# Patient Record
Sex: Male | Born: 1970
Health system: Southern US, Community
[De-identification: ages and names within clinical notes are randomized; demographics above are authoritative.]

## PROBLEM LIST (undated history)

## (undated) DIAGNOSIS — I7100 Dissection of unspecified site of aorta: Secondary | ICD-10-CM

## (undated) DIAGNOSIS — J309 Allergic rhinitis, unspecified: Secondary | ICD-10-CM

## (undated) DIAGNOSIS — T7840XA Allergy, unspecified, initial encounter: Secondary | ICD-10-CM

## (undated) DIAGNOSIS — R7303 Prediabetes: Secondary | ICD-10-CM

## (undated) DIAGNOSIS — E349 Endocrine disorder, unspecified: Secondary | ICD-10-CM

## (undated) DIAGNOSIS — E559 Vitamin D deficiency, unspecified: Secondary | ICD-10-CM

## (undated) DIAGNOSIS — E119 Type 2 diabetes mellitus without complications: Secondary | ICD-10-CM

## (undated) DIAGNOSIS — I10 Essential (primary) hypertension: Secondary | ICD-10-CM

## (undated) DIAGNOSIS — I714 Abdominal aortic aneurysm, without rupture: Secondary | ICD-10-CM

## (undated) DIAGNOSIS — G4730 Sleep apnea, unspecified: Secondary | ICD-10-CM

## (undated) HISTORY — DX: Type 2 diabetes mellitus without complications: E11.9

## (undated) HISTORY — DX: Allergic rhinitis, unspecified: J30.9

## (undated) HISTORY — DX: Endocrine disorder, unspecified: E34.9

## (undated) HISTORY — DX: Abdominal aortic aneurysm, without rupture: I71.4

## (undated) HISTORY — DX: Dissection of unspecified site of aorta: I71.00

## (undated) HISTORY — DX: Vitamin D deficiency, unspecified: E55.9

## (undated) HISTORY — DX: Allergy, unspecified, initial encounter: T78.40XA

## (undated) HISTORY — DX: Essential (primary) hypertension: I10

---

## 2009-03-07 HISTORY — PX: AORTA SURGERY: SHX548

## 2011-06-23 DIAGNOSIS — I7103 Dissection of thoracoabdominal aorta: Secondary | ICD-10-CM | POA: Insufficient documentation

## 2011-06-24 DIAGNOSIS — E349 Endocrine disorder, unspecified: Secondary | ICD-10-CM | POA: Insufficient documentation

## 2011-06-24 DIAGNOSIS — E559 Vitamin D deficiency, unspecified: Secondary | ICD-10-CM | POA: Insufficient documentation

## 2011-06-24 HISTORY — DX: Vitamin D deficiency, unspecified: E55.9

## 2011-06-24 HISTORY — DX: Endocrine disorder, unspecified: E34.9

## 2011-08-16 ENCOUNTER — Emergency Department: Payer: Self-pay | Admitting: Emergency Medicine

## 2011-08-16 LAB — CBC
HCT: 41.6 % (ref 40.0–52.0)
HGB: 13.6 g/dL (ref 13.0–18.0)
MCH: 28.4 pg (ref 26.0–34.0)
MCHC: 32.8 g/dL (ref 32.0–36.0)
MCV: 87 fL (ref 80–100)
RBC: 4.81 10*6/uL (ref 4.40–5.90)
RDW: 13.5 % (ref 11.5–14.5)

## 2011-08-16 LAB — COMPREHENSIVE METABOLIC PANEL
Albumin: 4 g/dL (ref 3.4–5.0)
Anion Gap: 8 (ref 7–16)
Calcium, Total: 9.3 mg/dL (ref 8.5–10.1)
Chloride: 104 mmol/L (ref 98–107)
EGFR (Non-African Amer.): >60
Glucose: 133 mg/dL — ABNORMAL HIGH (ref 65–99)
Osmolality: 282 (ref 275–301)
Potassium: 4.5 mmol/L (ref 3.5–5.1)
SGOT(AST): 34 U/L (ref 15–37)
SGPT (ALT): 42 U/L
Sodium: 140 mmol/L (ref 136–145)

## 2012-06-01 DIAGNOSIS — F419 Anxiety disorder, unspecified: Secondary | ICD-10-CM | POA: Insufficient documentation

## 2012-07-09 DIAGNOSIS — G4700 Insomnia, unspecified: Secondary | ICD-10-CM | POA: Insufficient documentation

## 2013-03-12 DIAGNOSIS — IMO0002 Reserved for concepts with insufficient information to code with codable children: Secondary | ICD-10-CM | POA: Insufficient documentation

## 2014-11-06 ENCOUNTER — Ambulatory Visit (INDEPENDENT_AMBULATORY_CARE_PROVIDER_SITE_OTHER): Payer: 59 | Admitting: Family Medicine

## 2014-11-06 ENCOUNTER — Encounter: Payer: Self-pay | Admitting: Family Medicine

## 2014-11-06 VITALS — BP 136/72 | HR 88 | Temp 98.0°F | Resp 18 | Ht 74.0 in | Wt 273.7 lb

## 2014-11-06 DIAGNOSIS — R519 Headache, unspecified: Secondary | ICD-10-CM | POA: Insufficient documentation

## 2014-11-06 DIAGNOSIS — R51 Headache: Secondary | ICD-10-CM

## 2014-11-06 DIAGNOSIS — I7100 Dissection of unspecified site of aorta: Secondary | ICD-10-CM | POA: Insufficient documentation

## 2014-11-06 DIAGNOSIS — I10 Essential (primary) hypertension: Secondary | ICD-10-CM

## 2014-11-06 DIAGNOSIS — R5383 Other fatigue: Secondary | ICD-10-CM | POA: Diagnosis not present

## 2014-11-06 DIAGNOSIS — J309 Allergic rhinitis, unspecified: Secondary | ICD-10-CM

## 2014-11-06 HISTORY — DX: Allergic rhinitis, unspecified: J30.9

## 2014-11-06 MED ORDER — AZITHROMYCIN 250 MG PO TABS: ORAL_TABLET | ORAL | Status: DC

## 2014-11-06 NOTE — Progress Notes (Signed)
Name: Marcus Zavala   MRN: [id number]    DOB: 09/18/1970   Date:11/06/2014       Progress Note  Subjective  Chief Complaint  Chief Complaint  Patient presents with  . Establish Care    NP (Dr. Carman Ching)  . Hypertension    Hypertension This is a chronic problem. Associated symptoms include headaches and malaise/fatigue (frequently feeling tired.). Pertinent negatives include no chest pain, neck pain or shortness of breath. Past treatments include calcium channel blockers, angiotensin blockers and diuretics. There is no history of kidney disease, CAD/MI or CVA.   Fatigue Pt. Is here for evaluation of feeling 'tired' This is described as being drowsy. He denies feeling lack of energy. No hx of depression.   Past Medical History  Diagnosis Date  . Allergy   . Hypertension     Past Surgical History  Procedure Laterality Date  . Aorta surgery  03/2009    Family History  Problem Relation Age of Onset  . Diabetes Mother   . Heart disease Father   . Cancer Father     Lymphatic  . Heart disease Sister   . Diabetes Sister   . Heart disease Brother   . Diabetes Brother     Social History   Social History  . Marital Status: Married    Spouse Name: N/A  . Number of Children: N/A  . Years of Education: N/A   Occupational History  . Not on file.   Social History Main Topics  . Smoking status: Never Smoker   . Smokeless tobacco: Never Used  . Alcohol Use: No  . Drug Use: No  . Sexual Activity:    Partners: Female   Other Topics Concern  . Not on file   Social History Narrative  . No narrative on file     Current outpatient prescriptions:  .  Azelastine HCl (ASTEPRO) 0.15 % SOLN, Place into the nose., Disp: , Rfl:  .  fluticasone (FLONASE) 50 MCG/ACT nasal spray, Place into the nose., Disp: , Rfl:  .  NEEDLE, DISP, 22 G 22G X 1-1/2" MISC, , Disp: , Rfl:  .  amLODipine (NORVASC) 5 MG tablet, Take 1 tablet by mouth at bedtime., Disp: , Rfl: 11 .  Loratadine 10 MG  CAPS, Take by mouth., Disp: , Rfl:  .  losartan-hydrochlorothiazide (HYZAAR) 100-25 MG per tablet, Take 1 tablet by mouth at bedtime., Disp: , Rfl:  .  Multiple Vitamin (MULTIVITAMIN) capsule, Take 1 capsule by mouth daily., Disp: , Rfl:  .  testosterone cypionate (DEPOTESTOSTERONE CYPIONATE) 200 MG/ML injection, INJECT 0.5 ML IM Q 7 DAYS, Disp: , Rfl: 3  Allergies  Allergen Reactions  . Ciprofloxacin   . Lisinopril   . Penicillins     Review of Systems  Constitutional: Positive for malaise/fatigue (frequently feeling tired.). Negative for fever, chills and weight loss.  HENT: Negative for congestion and sore throat.   Respiratory: Negative for shortness of breath.   Cardiovascular: Negative for chest pain.  Gastrointestinal: Negative for abdominal pain and blood in stool.  Musculoskeletal: Negative for myalgias, back pain and neck pain.  Neurological: Positive for headaches.  Endo/Heme/Allergies: Positive for environmental allergies.  Psychiatric/Behavioral: Negative for depression. The patient is not nervous/anxious.     Objective  Filed Vitals:   11/06/14 1512  BP: 144/88  Pulse: 88  Temp: 98 F (36.7 C)  TempSrc: Oral  Resp: 18  Height: 6\' 2"  (1.88 m)  Weight: 273 lb 11.2 oz (124.15 kg)  SpO2: 98%    Physical Exam  Constitutional: He is oriented to person, place, and time and well-developed, well-nourished, and in no distress.  HENT:  Head: Normocephalic and atraumatic.  Mouth/Throat: No posterior oropharyngeal erythema.  Tenderness to palpation over ethmoid sinus  Eyes: Pupils are equal, round, and reactive to light.  Neck: Normal range of motion. Neck supple.  Cardiovascular: Normal rate and regular rhythm.   Pulmonary/Chest: Effort normal and breath sounds normal.  Abdominal: Soft. Bowel sounds are normal.  Neurological: He is alert and oriented to person, place, and time.  Skin: Skin is warm and dry.  Nursing note and vitals reviewed.   Assessment &  Plan  1. Essential hypertension Blood pressure on manual repeat recheck is at goal. Patient reassure  2. Aortic dissection Stable and is being followed by cardiology at [facility].  3. Other fatigue Pain lab work for evaluation of persistent fatigue.  - CBC with Differential - Comprehensive Metabolic Panel (CMET) - TSH - Vitamin D (25 hydroxy) - Testosterone - B12  4. Sinus headache  - azithromycin (ZITHROMAX Z-PAK) 250 MG tablet; 2 tabs po x day 1, then 1 tab po q day x 4 days.  Dispense: 6 each; Refill: 0   [name] Asad [facility] 11/06/2014 3:34 PM

## 2014-11-12 LAB — CBC WITH DIFFERENTIAL/PLATELET
Basophils Absolute: 0 10*3/uL (ref 0.0–0.2)
Basos: 0 %
EOS (ABSOLUTE): 0.3 10*3/uL (ref 0.0–0.4)
EOS: 5 %
HEMATOCRIT: 45.6 % (ref 37.5–51.0)
Hemoglobin: 15.4 g/dL (ref 12.6–17.7)
IMMATURE GRANS (ABS): 0.1 10*3/uL (ref 0.0–0.1)
IMMATURE GRANULOCYTES: 2 %
LYMPHS: 21 %
Lymphocytes Absolute: 1.6 10*3/uL (ref 0.7–3.1)
MCH: 29.6 pg (ref 26.6–33.0)
MCHC: 33.8 g/dL (ref 31.5–35.7)
MCV: 88 fL (ref 79–97)
MONOCYTES: 8 %
Monocytes Absolute: 0.6 10*3/uL (ref 0.1–0.9)
NEUTROS PCT: 64 %
Neutrophils Absolute: 4.9 10*3/uL (ref 1.4–7.0)
Platelets: 276 10*3/uL (ref 150–379)
RBC: 5.21 x10E6/uL (ref 4.14–5.80)
RDW: 13.7 % (ref 12.3–15.4)
WBC: 7.6 10*3/uL (ref 3.4–10.8)

## 2014-11-12 LAB — COMPREHENSIVE METABOLIC PANEL
A/G RATIO: 1.7 (ref 1.1–2.5)
ALT: 22 IU/L (ref 0–44)
AST: 21 IU/L (ref 0–40)
Albumin: 4.5 g/dL (ref 3.5–5.5)
Alkaline Phosphatase: 37 IU/L — ABNORMAL LOW (ref 39–117)
BUN/Creatinine Ratio: 12 (ref 9–20)
BUN: 13 mg/dL (ref 6–24)
Bilirubin Total: 0.7 mg/dL (ref 0.0–1.2)
CALCIUM: 9.8 mg/dL (ref 8.7–10.2)
CO2: 27 mmol/L (ref 18–29)
CREATININE: 1.13 mg/dL (ref 0.76–1.27)
Chloride: 98 mmol/L (ref 97–108)
GFR, EST AFRICAN AMERICAN: 90 mL/min/{1.73_m2} (ref >59)
GFR, EST NON AFRICAN AMERICAN: 78 mL/min/{1.73_m2} (ref >59)
GLUCOSE: 114 mg/dL — AB (ref 65–99)
Globulin, Total: 2.7 g/dL (ref 1.5–4.5)
POTASSIUM: 4.1 mmol/L (ref 3.5–5.2)
Sodium: 142 mmol/L (ref 134–144)
TOTAL PROTEIN: 7.2 g/dL (ref 6.0–8.5)

## 2014-11-12 LAB — VITAMIN D 25 HYDROXY (VIT D DEFICIENCY, FRACTURES): VIT D 25 HYDROXY: 32.6 ng/mL (ref 30.0–100.0)

## 2014-11-12 LAB — TESTOSTERONE: TESTOSTERONE: 919 ng/dL (ref 348–1197)

## 2014-11-12 LAB — TSH: TSH: 2.7 u[IU]/mL (ref 0.450–4.500)

## 2014-11-12 LAB — VITAMIN B12: VITAMIN B 12: 359 pg/mL (ref 211–946)

## 2014-12-08 ENCOUNTER — Ambulatory Visit: Payer: 59 | Admitting: Family Medicine

## 2014-12-13 ENCOUNTER — Emergency Department: Payer: 59

## 2014-12-13 ENCOUNTER — Emergency Department
Admission: EM | Admit: 2014-12-13 | Discharge: 2014-12-13 | Disposition: A | Discharge status: Home / Self Care | Payer: 59 | Attending: Emergency Medicine | Admitting: Emergency Medicine

## 2014-12-13 ENCOUNTER — Encounter: Payer: Self-pay | Admitting: *Deleted

## 2014-12-13 DIAGNOSIS — Z880 Allergy status to penicillin: Secondary | ICD-10-CM | POA: Diagnosis not present

## 2014-12-13 DIAGNOSIS — R079 Chest pain, unspecified: Secondary | ICD-10-CM

## 2014-12-13 DIAGNOSIS — I10 Essential (primary) hypertension: Secondary | ICD-10-CM | POA: Diagnosis not present

## 2014-12-13 DIAGNOSIS — Z9889 Other specified postprocedural states: Secondary | ICD-10-CM | POA: Diagnosis not present

## 2014-12-13 LAB — BASIC METABOLIC PANEL
ANION GAP: 9 (ref 5–15)
BUN: 16 mg/dL (ref 6–20)
CHLORIDE: 101 mmol/L (ref 101–111)
CO2: 28 mmol/L (ref 22–32)
Calcium: 9.2 mg/dL (ref 8.9–10.3)
Creatinine, Ser: 1.13 mg/dL (ref 0.61–1.24)
GFR calc non Af Amer: >60 mL/min (ref >60)
Glucose, Bld: 167 mg/dL — ABNORMAL HIGH (ref 65–99)
POTASSIUM: 3.6 mmol/L (ref 3.5–5.1)
Sodium: 138 mmol/L (ref 135–145)

## 2014-12-13 LAB — TROPONIN I: Troponin I: <0.03 ng/mL (ref <0.031)

## 2014-12-13 LAB — CBC
HEMATOCRIT: 46.5 % (ref 40.0–52.0)
HEMOGLOBIN: 15.5 g/dL (ref 13.0–18.0)
MCH: 29.2 pg (ref 26.0–34.0)
MCHC: 33.4 g/dL (ref 32.0–36.0)
MCV: 87.2 fL (ref 80.0–100.0)
Platelets: 239 10*3/uL (ref 150–440)
RBC: 5.33 MIL/uL (ref 4.40–5.90)
RDW: 13.7 % (ref 11.5–14.5)
WBC: 8.7 10*3/uL (ref 3.8–10.6)

## 2014-12-13 MED ORDER — IOHEXOL 350 MG/ML SOLN
100.0000 mL | Freq: Once | INTRAVENOUS | Status: AC | PRN
  Administered 2014-12-13: 100 mL via INTRAVENOUS

## 2014-12-13 NOTE — ED Notes (Signed)
Pt reports intermittent sharp chest pains since this am, lasting 5-10 seconds at a time. Hx of aortic dissection with surgery.

## 2014-12-13 NOTE — ED Provider Notes (Signed)
[facility] Emergency Department Provider Note     Time seen: ----------------------------------------- 5:25 PM on 12/13/2014 -----------------------------------------    I have reviewed the triage vital signs and the nursing notes.   HISTORY  Chief Complaint Chest Pain    HPI Marcus Zavala is a 44 y.o. male who presents to ER with sharp chest pains on the left side of his chest since this morning. States he last 5-10 seconds at a time, he has a history of aortic dissection with surgery for same about 5 years ago in [place]. Does not remember the symptoms that he had at that time because he was unconscious. This pain does not radiate, nothing makes it better or worse.   Past Medical History  Diagnosis Date  . Allergy   . Hypertension   . Aortic dissection [facility])     Patient Active Problem List   Diagnosis Date Noted  . Hypertension 11/06/2014  . Aortic dissection (HCC) 11/06/2014  . Fatigue 11/06/2014  . Sinus headache 11/06/2014  . Allergic rhinitis 11/06/2014    Past Surgical History  Procedure Laterality Date  . Aorta surgery  03/2009    Allergies Ciprofloxacin; Lisinopril; and Penicillins  Social History Social History  Substance Use Topics  . Smoking status: Never Smoker   . Smokeless tobacco: Never Used  . Alcohol Use: No    Review of Systems Constitutional: Negative for fever. Eyes: Negative for visual changes. ENT: Negative for sore throat. Cardiovascular: positive for chest pain Respiratory: Negative for shortness of breath. Gastrointestinal: Negative for abdominal pain, vomiting and diarrhea. Genitourinary: Negative for dysuria. Musculoskeletal: Negative for back pain. Skin: Negative for rash. Neurological: Negative for headaches, focal weakness or numbness.  10-point ROS otherwise negative.  ____________________________________________   PHYSICAL EXAM:  VITAL SIGNS: ED Triage Vitals  Enc Vitals Group      BP 12/13/14 1400 134/80 mmHg     Pulse Rate 12/13/14 1400 76     Resp 12/13/14 1400 16     Temp 12/13/14 1400 98.2 F (36.8 C)     Temp src --      SpO2 12/13/14 1400 98 %     Weight --      Height --      Head Cir --      Peak Flow --      Pain Score 12/13/14 1320 7     Pain Loc --      Pain Edu? --      Excl. in GC? --     Constitutional: Alert and oriented. Well appearing and in no distress. Eyes: Conjunctivae are normal. PERRL. Normal extraocular movements. ENT   Head: Normocephalic and atraumatic.   Nose: No congestion/rhinnorhea.   Mouth/Throat: Mucous membranes are moist.   Neck: No stridor. Cardiovascular: Normal rate, regular rhythm. Normal and symmetric distal pulses are present in all extremities. No murmurs, rubs, or gallops. Respiratory: Normal respiratory effort without tachypnea nor retractions. Breath sounds are clear and equal bilaterally. No wheezes/rales/rhonchi. Gastrointestinal: Soft and nontender. No distention. No abdominal bruits.  Musculoskeletal: Nontender with normal range of motion in all extremities. No joint effusions.  No lower extremity tenderness nor edema. Neurologic:  Normal speech and language. No gross focal neurologic deficits are appreciated. Speech is normal. No gait instability. Skin:  Skin is warm, dry and intact. No rash noted. Psychiatric: Mood and affect are normal. Speech and behavior are normal. Patient exhibits appropriate insight and judgment. ____________________________________________  EKG: Interpreted by me.normal sinus rhythm with  incomplete right bundle branch block, nonspecific ST and T wave changes, normal axis. Rate is 77 bpm  ____________________________________________  ED COURSE:  Pertinent labs & imaging results that were available during my care of the patient were reviewed by me and considered in my medical decision making (see chart for details). Patient is in no acute distress, will check  cardiac labs and possibly image the aorta. ____________________________________________    LABS (pertinent positives/negatives)  Labs Reviewed  BASIC METABOLIC PANEL - Abnormal; Notable for the following:    Glucose, Bld 167 (*)    All other components within normal limits  CBC  TROPONIN I  TROPONIN I    RADIOLOGY Images were viewed by me  IMPRESSION: Widening of the mediastinum, with unknown acuity, as no prior studies are available for comparison. Given this, abnormality of the aorta cannot be excluded. If vascular abnormality is clinically suspected, CT of the chest should be considered.  No evidence of focal airspace consolidation or pulmonary edema. I have discussed CT results with the radiologist extensively, no concerning worsening of his dissection. ____________________________________________  FINAL ASSESSMENT AND PLAN  Chest pain, history of aortic dissection  Plan: Patient with labs and imaging as dictated above. Patient is to follow-up with his chest surgeon in 1-2 days for recheck. This point his troponins negative there are no worsening signs on his CT and his labs are stable.   Emily Filbert, MD   Emily Filbert, MD 12/13/14 [phone]

## 2014-12-13 NOTE — Discharge Instructions (Signed)
Nonspecific Chest Pain  °Chest pain can be caused by many different conditions. There is always a chance that your pain could be related to something serious, such as a heart attack or a blood clot in your lungs. Chest pain can also be caused by conditions that are not life-threatening. If you have chest pain, it is very important to follow up with your health care provider. °CAUSES  °Chest pain can be caused by: °· Heartburn. °· Pneumonia or bronchitis. °· Anxiety or stress. °· Inflammation around your heart (pericarditis) or lung (pleuritis or pleurisy). °· A blood clot in your lung. °· A collapsed lung (pneumothorax). It can develop suddenly on its own (spontaneous pneumothorax) or from trauma to the chest. °· Shingles infection (varicella-zoster virus). °· Heart attack. °· Damage to the bones, muscles, and cartilage that make up your chest wall. This can include: °¨ Bruised bones due to injury. °¨ Strained muscles or cartilage due to frequent or repeated coughing or overwork. °¨ Fracture to one or more ribs. °¨ Sore cartilage due to inflammation (costochondritis). °RISK FACTORS  °Risk factors for chest pain may include: °· Activities that increase your risk for trauma or injury to your chest. °· Respiratory infections or conditions that cause frequent coughing. °· Medical conditions or overeating that can cause heartburn. °· Heart disease or family history of heart disease. °· Conditions or health behaviors that increase your risk of developing a blood clot. °· Having had chicken pox (varicella zoster). °SIGNS AND SYMPTOMS °Chest pain can feel like: °· Burning or tingling on the surface of your chest or deep in your chest. °· Crushing, pressure, aching, or squeezing pain. °· Dull or sharp pain that is worse when you move, cough, or take a deep breath. °· Pain that is also felt in your back, neck, shoulder, or arm, or pain that spreads to any of these areas. °Your chest pain may come and go, or it may stay  constant. °DIAGNOSIS °Lab tests or other studies may be needed to find the cause of your pain. Your health care provider may have you take a test called an ambulatory ECG (electrocardiogram). An ECG records your heartbeat patterns at the time the test is performed. You may also have other tests, such as: °· Transthoracic echocardiogram (TTE). During echocardiography, sound waves are used to create a picture of all of the heart structures and to look at how blood flows through your heart. °· Transesophageal echocardiogram (TEE). This is a more advanced imaging test that obtains images from inside your body. It allows your health care provider to see your heart in finer detail. °· Cardiac monitoring. This allows your health care provider to monitor your heart rate and rhythm in real time. °· Holter monitor. This is a portable device that records your heartbeat and can help to diagnose abnormal heartbeats. It allows your health care provider to track your heart activity for several days, if needed. °· Stress tests. These can be done through exercise or by taking medicine that makes your heart beat more quickly. °· Blood tests. °· Imaging tests. °TREATMENT  °Your treatment depends on what is causing your chest pain. Treatment may include: °· Medicines. These may include: °¨ Acid blockers for heartburn. °¨ Anti-inflammatory medicine. °¨ Pain medicine for inflammatory conditions. °¨ Antibiotic medicine, if an infection is present. °¨ Medicines to dissolve blood clots. °¨ Medicines to treat coronary artery disease. °· Supportive care for conditions that do not require medicines. This may include: °¨ Resting. °¨ Applying heat   or cold packs to injured areas. °¨ Limiting activities until pain decreases. °HOME CARE INSTRUCTIONS °· If you were prescribed an antibiotic medicine, finish it all even if you start to feel better. °· Avoid any activities that bring on chest pain. °· Do not use any tobacco products, including  cigarettes, chewing tobacco, or electronic cigarettes. If you need help quitting, ask your health care provider. °· Do not drink alcohol. °· Take medicines only as directed by your health care provider. °· Keep all follow-up visits as directed by your health care provider. This is important. This includes any further testing if your chest pain does not go away. °· If heartburn is the cause for your chest pain, you may be told to keep your head raised (elevated) while sleeping. This reduces the chance that acid will go from your stomach into your esophagus. °· Make lifestyle changes as directed by your health care provider. These may include: °¨ Getting regular exercise. Ask your health care provider to suggest some activities that are safe for you. °¨ Eating a heart-healthy diet. A registered dietitian can help you to learn healthy eating options. °¨ Maintaining a healthy weight. °¨ Managing diabetes, if necessary. °¨ Reducing stress. °SEEK MEDICAL CARE IF: °· Your chest pain does not go away after treatment. °· You have a rash with blisters on your chest. °· You have a fever. °SEEK IMMEDIATE MEDICAL CARE IF:  °· Your chest pain is worse. °· You have an increasing cough, or you cough up blood. °· You have severe abdominal pain. °· You have severe weakness. °· You faint. °· You have chills. °· You have sudden, unexplained chest discomfort. °· You have sudden, unexplained discomfort in your arms, back, neck, or jaw. °· You have shortness of breath at any time. °· You suddenly start to sweat, or your skin gets clammy. °· You feel nauseous or you vomit. °· You suddenly feel light-headed or dizzy. °· Your heart begins to beat quickly, or it feels like it is skipping beats. °These symptoms may represent a serious problem that is an emergency. Do not wait to see if the symptoms will go away. Get medical help right away. Call your local emergency services (911 in the [place] Do not drive yourself to the hospital. °  °This  information is not intended to replace advice given to you by your health care provider. Make sure you discuss any questions you have with your health care provider. °  °Document Released: 12/01/2004 Document Revised: 03/14/2014 Document Reviewed: 09/27/2013 °Elsevier Interactive Patient Education ©2016 [employer] ° °

## 2015-03-10 ENCOUNTER — Encounter: Payer: Self-pay | Admitting: Family Medicine

## 2015-03-10 ENCOUNTER — Ambulatory Visit (INDEPENDENT_AMBULATORY_CARE_PROVIDER_SITE_OTHER): Payer: 59 | Admitting: Family Medicine

## 2015-03-10 VITALS — BP 142/82 | HR 92 | Temp 98.6°F | Resp 16 | Ht 74.0 in | Wt 282.9 lb

## 2015-03-10 DIAGNOSIS — H6593 Unspecified nonsuppurative otitis media, bilateral: Secondary | ICD-10-CM | POA: Diagnosis not present

## 2015-03-10 DIAGNOSIS — H6093 Unspecified otitis externa, bilateral: Secondary | ICD-10-CM

## 2015-03-10 DIAGNOSIS — H6590 Unspecified nonsuppurative otitis media, unspecified ear: Secondary | ICD-10-CM | POA: Insufficient documentation

## 2015-03-10 MED ORDER — OFLOXACIN 0.3 % OT SOLN: 5.0000 [drp] | Freq: Every day | OTIC | Status: DC

## 2015-03-10 MED ORDER — LEVOFLOXACIN 500 MG PO TABS: 500.0000 mg | ORAL_TABLET | Freq: Every day | ORAL | Status: DC

## 2015-03-10 NOTE — Progress Notes (Signed)
Name: Marcus Zavala   MRN: [id number]    DOB: 20-May-1970   Date:03/10/2015       Progress Note  Subjective  Chief Complaint  Chief Complaint  Patient presents with  . Sinusitis    HPI  Patient is here today with concerns regarding the following symptoms sneezing, ear pressure, left ear pain and sinus pressure that started 2 weeks ago.  Not associated with fever. Has tried the following home remedies: went to walk-in was given antibiotics (Doxycycline) and ear drops ( Neo-PolyM-hydrocortisone) for left ear infection, has finished with no relief. Now having right ear discomfort too.   Past Medical History  Diagnosis Date  . Allergy   . Hypertension   . Aortic dissection [facility](HCC)     Social History  Substance Use Topics  . Smoking status: Never Smoker   . Smokeless tobacco: Never Used  . Alcohol Use: No     Current outpatient prescriptions:  .  amLODipine (NORVASC) 5 MG tablet, Take 1 tablet by mouth at bedtime., Disp: , Rfl: 11 .  Azelastine HCl (ASTEPRO) 0.15 % SOLN, Place into the nose., Disp: , Rfl:  .  fluticasone (FLONASE) 50 MCG/ACT nasal spray, Place into the nose., Disp: , Rfl:  .  losartan-hydrochlorothiazide (HYZAAR) 100-25 MG per tablet, Take 1 tablet by mouth at bedtime., Disp: , Rfl:  .  Multiple Vitamin (MULTIVITAMIN) capsule, Take 1 capsule by mouth daily., Disp: , Rfl:  .  NEEDLE, DISP, 22 G 22G X 1-1/2" MISC, , Disp: , Rfl:  .  testosterone cypionate (DEPOTESTOSTERONE CYPIONATE) 200 MG/ML injection, INJECT 0.5 ML IM Q 7 DAYS, Disp: , Rfl: 3  Allergies  Allergen Reactions  . Ciprofloxacin   . Lisinopril   . Penicillins     ROS  Positive for fatigue, nasal congestion, sinus pressure, ear fullness, cough as mentioned in HPI, otherwise all systems reviewed and are negative.  Objective  Filed Vitals:   03/10/15 1522  BP: 142/82  Pulse: 92  Temp: 98.6 F (37 C)  TempSrc: Oral  Resp: 16  Height: 6\' 2"  (1.88 m)  Weight: 282 lb 14.4 oz (128.323 kg)   SpO2: 97%   Body mass index is 36.31 kg/(m^2).   Physical Exam  Constitutional: Patient appears well-developed and well-nourished. In no acute distress but does appear to be fatigued from acute illness. HEENT:  - Head: Normocephalic and atraumatic.  - Ears: RIGHT TM bulging with erythema, tension, clear exudate and canals red and inflamed.  - Nose: Nasal mucosa boggy and congested.  - Mouth/Throat: Oropharynx is moist with slight erythema of bilateral tonsils without hypertrophy or exudates. Post nasal drainage present.  - Eyes: Conjunctivae clear, EOM movements normal. PERRLA. No scleral icterus.  Neck: Normal range of motion. Neck supple. No JVD present. No thyromegaly present. No local lymphadenopathy. Cardiovascular: Regular rate, regular rhythm with no murmurs heard.  Pulmonary/Chest: Effort normal and breath sounds clear in all lung fields.  Musculoskeletal: Normal range of motion bilateral UE and LE, no joint effusions. Skin: Skin is warm and dry. No rash noted. Psychiatric: Patient has a normal mood and affect. Behavior is normal in office today. Judgment and thought content normal in office today.   Assessment & Plan  1. Otitis media with effusion, bilateral Discussed treatment options and due to his allergies (he does not recall swelling, anaphylaxis or exactly what is allergic reaction to pcn and cipro was in the past) he has been counseled on monitoring for SE as there is some  cross over between Levofloxacin/Ofloxacin and Ciprofloxacin.  - levofloxacin (LEVAQUIN) 500 MG tablet; Take 1 tablet (500 mg total) by mouth daily.  Dispense: 10 tablet; Refill: 0  2. Otitis externa of both ears If symptoms persist or reoccur despite 2nd round of treatment I will refer him back to [facility], he has been seen there before.  - ofloxacin (FLOXIN OTIC) 0.3 % otic solution; Place 5 drops into both ears daily.  Dispense: 10 mL; Refill: 0

## 2015-05-06 ENCOUNTER — Ambulatory Visit: Payer: 59 | Admitting: Family Medicine

## 2015-07-01 ENCOUNTER — Encounter: Payer: Self-pay | Admitting: Family Medicine

## 2015-07-01 ENCOUNTER — Ambulatory Visit (INDEPENDENT_AMBULATORY_CARE_PROVIDER_SITE_OTHER): Payer: 59 | Admitting: Family Medicine

## 2015-07-01 VITALS — BP 136/78 | HR 87 | Temp 98.9°F | Resp 17 | Ht 74.0 in | Wt 281.0 lb

## 2015-07-01 DIAGNOSIS — J0111 Acute recurrent frontal sinusitis: Secondary | ICD-10-CM | POA: Insufficient documentation

## 2015-07-01 DIAGNOSIS — Z6835 Body mass index (BMI) 35.0-35.9, adult: Secondary | ICD-10-CM

## 2015-07-01 DIAGNOSIS — J0100 Acute maxillary sinusitis, unspecified: Secondary | ICD-10-CM

## 2015-07-01 DIAGNOSIS — M25551 Pain in right hip: Secondary | ICD-10-CM | POA: Insufficient documentation

## 2015-07-01 MED ORDER — PREDNISONE 10 MG (21) PO TBPK: 10.0000 mg | ORAL_TABLET | Freq: Every day | ORAL | Status: DC

## 2015-07-01 MED ORDER — AZITHROMYCIN 250 MG PO TABS: ORAL_TABLET | ORAL | Status: DC

## 2015-07-01 NOTE — Progress Notes (Signed)
Name: Marcus Zavala   MRN: [id number]    DOB: 11/11/1970   Date:07/01/2015       Progress Note  Subjective  Chief Complaint  Chief Complaint  Patient presents with  . Sinus Problem  . Fatigue    Sinus Problem This is a new problem. There has been no fever. Associated symptoms include congestion, headaches, sinus pressure and sneezing. Pertinent negatives include no chills, coughing or sore throat. Treatments tried: Has used Flonase and Claritin spray.   Obesity: Pt. Presents for follow up of Obesity, he weighs 281 lbs, BMI of 36.08kg/m2. He wishes to lose weight, asking for help in losing weight. He needs dietary guidance (eats steaks, pork chops, candy, sweet tea). He is physically active, gets on the treadmill every other day.   Right Hip Pain: Pt. Present with pain in the right groin, present for over a month, comes on with flexion as on trying to put one leg on top of the other. He is able to walk fine.    Past Medical History  Diagnosis Date  . Allergy   . Hypertension   . Aortic dissection [facility](HCC)     Past Surgical History  Procedure Laterality Date  . Aorta surgery  03/2009    Family History  Problem Relation Age of Onset  . Diabetes Mother   . Heart disease Father   . Cancer Father     Lymphatic  . Heart disease Brother   . Diabetes Brother     Social History   Social History  . Marital Status: Married    Spouse Name: N/A  . Number of Children: N/A  . Years of Education: N/A   Occupational History  . Not on file.   Social History Main Topics  . Smoking status: Never Smoker   . Smokeless tobacco: Never Used  . Alcohol Use: No  . Drug Use: No  . Sexual Activity:    Partners: Female   Other Topics Concern  . Not on file   Social History Narrative     Current outpatient prescriptions:  .  amLODipine (NORVASC) 5 MG tablet, Take 1 tablet by mouth at bedtime., Disp: , Rfl: 11 .  Azelastine HCl (ASTEPRO) 0.15 % SOLN, Place into the nose., Disp: ,  Rfl:  .  losartan-hydrochlorothiazide (HYZAAR) 100-25 MG per tablet, Take 1 tablet by mouth at bedtime., Disp: , Rfl:  .  Multiple Vitamin (MULTIVITAMIN) capsule, Take 1 capsule by mouth daily., Disp: , Rfl:  .  NEEDLE, DISP, 22 G 22G X 1-1/2" MISC, , Disp: , Rfl:  .  oseltamivir (TAMIFLU) 75 MG capsule, TK 1 C PO QD FOR 10 DAYS, Disp: , Rfl: 0 .  testosterone cypionate (DEPOTESTOSTERONE CYPIONATE) 200 MG/ML injection, INJECT 0.5 ML IM Q 7 DAYS, Disp: , Rfl: 3  Allergies  Allergen Reactions  . Ciprofloxacin   . Lisinopril   . Penicillins     Review of Systems  Constitutional: Negative for fever and chills.  HENT: Positive for congestion, sinus pressure and sneezing. Negative for sore throat.   Respiratory: Negative for cough.   Neurological: Positive for headaches.    Objective  Filed Vitals:   07/01/15 1402  BP: 136/78  Pulse: 87  Temp: 98.9 F (37.2 C)  TempSrc: Oral  Resp: 17  Height: 6\' 2"  (1.88 m)  Weight: 281 lb (127.461 kg)  SpO2: 97%    Physical Exam  Constitutional: He is oriented to person, place, and time and well-developed, well-nourished, and in  no distress.  HENT:  Right Ear: Tympanic membrane and ear canal normal.  Nose: Right sinus exhibits maxillary sinus tenderness. Left sinus exhibits maxillary sinus tenderness.  Mouth/Throat: Posterior oropharyngeal erythema present.  L. Ear cerumen impaction, TM not visualized. Nasal mucosa mildly inflamed.  Cardiovascular: Normal rate, regular rhythm and normal heart sounds.   Pulmonary/Chest: Effort normal and breath sounds normal.  Musculoskeletal:       Legs: Tenderness in the right groin area with flexion and internal rotation of the right hip.  Neurological: He is alert and oriented to person, place, and time.  Nursing note and vitals reviewed.    Assessment & Plan  1. Subacute maxillary sinusitis  - azithromycin (ZITHROMAX) 250 MG tablet; 2 tabs po x day 1, then 1 tab po q day x 4 days  Dispense: 6  tablet; Refill: 0 - predniSONE (STERAPRED UNI-PAK 21 TAB) 10 MG (21) TBPK tablet; Take 1 tablet (10 mg total) by mouth daily. 60 50 40 then STOP  Dispense: 21 tablet; Refill: 0  2. Acute right hip pain Likely muscular sprain versus arthritis of the hip. For now advise conservative therapy, avoiding activities that bring on the pain. If no improvement, will return for x-ray.  3. Severe obesity (BMI 35.0-35.9 with comorbidity) (HCC)  - Amb ref to Medical Nutrition Therapy-MNT   Khylee Algeo Asad A[facility] 07/01/2015 2:18 PM

## 2015-07-14 ENCOUNTER — Encounter: Payer: 59 | Attending: Family Medicine | Admitting: Dietician

## 2015-07-14 ENCOUNTER — Encounter: Payer: Self-pay | Admitting: Dietician

## 2015-07-14 VITALS — Ht 75.0 in | Wt 282.7 lb

## 2015-07-14 DIAGNOSIS — E669 Obesity, unspecified: Secondary | ICD-10-CM | POA: Diagnosis present

## 2015-07-14 NOTE — Patient Instructions (Signed)
Balance meals with protein, 3-4 servings of carbohydrate (starch, fruit, milk/yogurt) and non-starchy vegetables. Switch to a lower sugar cereal such as cheerios. Switch to unsweetened tea. Measure amount of ice cream that you normally eat in the evenings and decrease by 1/3 or 1/2. Add non-starchy vegetables to lunch and dinner. Include fruit as part of your carbohydrate. Increase exercise to 30 minutes per day.

## 2015-07-14 NOTE — Progress Notes (Signed)
Medical Nutrition Therapy: Visit start time: 1315 end time: 1400 Assessment:  Diagnosis: obesity Past medical history: hypertension, aortic ressection Psychosocial issues/ stress concerns: Patient rates his stress as "moderate" but indicates "ok" as to how well he is dealing with his stress. Preferred learning method:  . Auditory  Current weight: 282.7 lbs  Height: 75 in Medications, supplements: see list Progress and evaluation:  Patient in for initial medical nutrition assessment appointment. He rates his motivation to make diet changes as a "7" on a 0-10 scale. He states, "My wife has been concerned and I have 2 young children (ages 4&5)". He gives a goal weight of 240 lbs which he weighed approx. 5 years ago. He states that chocolate candy (King Size bars), sweet tea and large portions at dinner meal are some of his problem areas. He likes fruits and a variety of vegetables but doesn't include on a daily basis. He has never dieted before and has not made any diet changes thus far. He reports a strong family history of diabetes. His diet is low in fruits, vegetables, calcium sources and whole grains. 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 Physical activity: none  Dietary Intake:  Usual eating pattern includes 3 meals and 1-2 snacks per day. Dining out frequency: 2cmeals per week.  Breakfast: 8:30am- 4 cups of Frosted Flakes + whole milk Lunch: 1:00pm- 2 peanut butter/jelly sandwiches, sweet tea Snack: candy Supper: 5:30-6:00pm- pasta/meat sauce or chicken cutlets, rice, sweet tea or steak, potatoes or rice, sweet tea Snack: 2 cups of ice cream Beverages: water, sweet tea  Nutrition Care Education: Weight control: Instructed on a meal plan based on 2000 calories including carbohydrate counting and how to better balance carbohydrate, protein and non-starchy vegetables. Encouraged to focus on foods that need to be added to help meet nutrient needs vs. restriction.  Discussed strategies vs.  relying on will power such as picking up a regular size candy bar 1-2 times per week vs. buying a package of bars. Gave and reviewed sample menus. Discouraged over- restriction which will usually lead to regain of any weight that was lost. Also, encouraged to incorporate exercise and discussed options.  Nutritional Diagnosis:  [place]-3.3 Overweight/obesity As related to sweetened beverages, frequent intake of candy bars and large portions at evening meal..  As evidenced by diet history..  Intervention:  Balance meals with protein, 3-4 servings of carbohydrate (starch, fruit, milk/yogurt) and non-starchy vegetables. Switch to a lower sugar cereal such as cheerios. Switch to unsweetened tea. Measure amount of ice cream that you normally eat in the evenings and decrease by 1/3 or 1/2. Add non-starchy vegetables to lunch and dinner. Include fruit as part of your carbohydrate. Increase exercise to 30 minutes per day.   Education Materials given:  . Plate Planner . Food lists/ Planning A Balanced Meal . Sample meal pattern/ menus . Goals/ instructions  Learner/ who was taught:  . Patient  Level of understanding: . Partial understanding; needs review/ practice Learning barriers: . None Willingness to learn/ readiness for change: . Acceptance, ready for change  Monitoring and Evaluation:  Dietary intake, exercise, , and body weight      follow up:08/11/15 at 1:15pm

## 2015-08-11 ENCOUNTER — Ambulatory Visit: Payer: 59 | Admitting: Dietician

## 2015-08-25 ENCOUNTER — Encounter: Payer: Self-pay | Admitting: Dietician

## 2015-12-21 ENCOUNTER — Ambulatory Visit (INDEPENDENT_AMBULATORY_CARE_PROVIDER_SITE_OTHER): Payer: 59 | Admitting: Family Medicine

## 2015-12-21 ENCOUNTER — Encounter: Payer: Self-pay | Admitting: Family Medicine

## 2015-12-21 VITALS — BP 134/92 | HR 84 | Temp 98.0°F | Resp 17 | Ht 75.0 in | Wt 285.8 lb

## 2015-12-21 DIAGNOSIS — R7989 Other specified abnormal findings of blood chemistry: Secondary | ICD-10-CM

## 2015-12-21 DIAGNOSIS — I10 Essential (primary) hypertension: Secondary | ICD-10-CM

## 2015-12-21 DIAGNOSIS — E119 Type 2 diabetes mellitus without complications: Secondary | ICD-10-CM | POA: Diagnosis not present

## 2015-12-21 DIAGNOSIS — R002 Palpitations: Secondary | ICD-10-CM

## 2015-12-21 DIAGNOSIS — Z833 Family history of diabetes mellitus: Secondary | ICD-10-CM

## 2015-12-21 DIAGNOSIS — E291 Testicular hypofunction: Secondary | ICD-10-CM

## 2015-12-21 LAB — POCT GLYCOSYLATED HEMOGLOBIN (HGB A1C): Hemoglobin A1C: 6.9

## 2015-12-21 NOTE — Progress Notes (Signed)
Name: Marcus OtaMark C Heino   MRN: [id number]    DOB: 1970-11-14   Date:12/21/2015       Progress Note  Subjective  Chief Complaint  Chief Complaint  Patient presents with  . Hospitalization Follow-up    [facility] Palpatations    HPI  Palpitations: Pt. Presents for Hospital follow up [facility]. He was admitted for palpitations (feeling of heart racing), lasting for longer than 10 minutes, [facility]. He stayed in the hospital for one day had lab work, 2-D echo which were all normal. Holter results pending. Pt. Has history of resistant hypertension, sleep apnea. Treated with anti-hypertensives, managed by Cardiology.    Past Medical History:  Diagnosis Date  . Allergy   . Aortic dissection (HCC)   . Hypertension     Past Surgical History:  Procedure Laterality Date  . AORTA SURGERY  03/2009    Family History  Problem Relation Age of Onset  . Diabetes Mother   . Heart disease Father   . Cancer Father     Lymphatic  . Heart disease Brother   . Diabetes Brother     Social History   Social History  . Marital status: Married    Spouse name: N/A  . Number of children: N/A  . Years of education: N/A   Occupational History  . Not on file.   Social History Main Topics  . Smoking status: Never Smoker  . Smokeless tobacco: Never Used  . Alcohol use No  . Drug use: No  . Sexual activity: Yes    Partners: Female   Other Topics Concern  . Not on file   Social History Narrative  . No narrative on file     Current Outpatient Prescriptions:  .  amLODipine (NORVASC) 5 MG tablet, Take 1 tablet by mouth at bedtime., Disp: , Rfl: 11 .  Azelastine HCl (ASTEPRO) 0.15 % SOLN, Place into the nose. Reported on 07/14/2015, Disp: , Rfl:  .  losartan-hydrochlorothiazide (HYZAAR) 100-25 MG per tablet, Take 1 tablet by mouth at bedtime., Disp: , Rfl:  .  Multiple Vitamin (MULTIVITAMIN) capsule, Take 1 capsule by mouth daily. Reported on 07/14/2015, Disp: , Rfl:  .   NEEDLE, DISP, 22 G 22G X 1-1/2" MISC, Reported on 07/14/2015, Disp: , Rfl:  .  testosterone cypionate (DEPOTESTOSTERONE CYPIONATE) 200 MG/ML injection, Reported on 07/14/2015, Disp: , Rfl: 3 .  azithromycin (ZITHROMAX) 250 MG tablet, 2 tabs po x day 1, then 1 tab po q day x 4 days (Patient not taking: Reported on 12/21/2015), Disp: 6 tablet, Rfl: 0 .  predniSONE (STERAPRED UNI-PAK 21 TAB) 10 MG (21) TBPK tablet, Take 1 tablet (10 mg total) by mouth daily. 60 50 40 30 20 10  then STOP (Patient not taking: Reported on 12/21/2015), Disp: 21 tablet, Rfl: 0  Allergies  Allergen Reactions  . Penicillins Hives  . Ciprofloxacin Nausea Only  . Lisinopril Swelling    Hand swelling     Review of Systems  Constitutional: Positive for malaise/fatigue. Negative for chills and fever.  Cardiovascular: Negative for chest pain and palpitations.  Gastrointestinal: Negative for abdominal pain.  Psychiatric/Behavioral: Negative for depression. The patient is not nervous/anxious.     Objective  Vitals:   12/21/15 0938  BP: (!) 134/92  Pulse: 84  Resp: 17  Temp: 98 F (36.7 C)  TempSrc: Oral  SpO2: 96%  Weight: 285 lb 12.8 oz (129.6 kg)  Height: 6\' 3"  (1.905 m)  Physical Exam  Constitutional: He is well-developed, well-nourished, and in no distress.  HENT:  Head: Normocephalic and atraumatic.  Cardiovascular: Normal rate, regular rhythm, S1 normal, S2 normal and normal heart sounds.   No murmur heard. Pulmonary/Chest: Effort normal and breath sounds normal. He has no wheezes. He has no rhonchi.  Abdominal: Soft. Bowel sounds are normal. There is no tenderness.  Musculoskeletal:       Right ankle: He exhibits no swelling.       Left ankle: He exhibits no swelling.  Skin: Skin is warm and dry.  Psychiatric: Mood, memory, affect and judgment normal.  Nursing note and vitals reviewed.    Assessment & Plan  1. Essential hypertension Being managed by cardiology, blood pressure elevated and he  was asked to contact cardiologist, amlodipine recently increased from 5 mg to 10 mg - Lipid Profile  2. Low testosterone level in male  - Testosterone  3. Family history of diabetes mellitus in mother Hemoglobin A1c is 6.9%, consistent with well-controlled diabetes, educated on dietary and lifestyle modification including the need to increase physical activity to lose weight. Recheck in 3 months - POCT HgB A1C  4. Intermittent palpitations Now resolved, hospital records from [facility] reviewed, patient to follow-up with cardiology  5. Controlled type 2 diabetes mellitus without complication, unspecified long term insulin use status (HCC) As above, will follow-up in 3 months    Raychell Holcomb Asad A[facility] 12/21/2015 10:14 AM

## 2015-12-22 LAB — LIPID PANEL
Cholesterol: 119 mg/dL — ABNORMAL LOW (ref 125–200)
HDL: 36 mg/dL — ABNORMAL LOW (ref >=40)
LDL CALC: 69 mg/dL (ref <130)
Total CHOL/HDL Ratio: 3.3 Ratio (ref <=5.0)
Triglycerides: 70 mg/dL (ref <150)
VLDL: 14 mg/dL (ref <30)

## 2015-12-22 LAB — TESTOSTERONE: TESTOSTERONE: 224 ng/dL — AB (ref 250–827)

## 2016-03-15 ENCOUNTER — Ambulatory Visit (INDEPENDENT_AMBULATORY_CARE_PROVIDER_SITE_OTHER): Payer: 59 | Admitting: Family Medicine

## 2016-03-15 ENCOUNTER — Telehealth: Payer: Self-pay

## 2016-03-15 ENCOUNTER — Encounter: Payer: Self-pay | Admitting: Family Medicine

## 2016-03-15 VITALS — BP 131/75 | HR 88 | Temp 98.7°F | Resp 16 | Ht 75.0 in | Wt 293.0 lb

## 2016-03-15 DIAGNOSIS — R6889 Other general symptoms and signs: Secondary | ICD-10-CM | POA: Diagnosis not present

## 2016-03-15 DIAGNOSIS — J209 Acute bronchitis, unspecified: Secondary | ICD-10-CM | POA: Diagnosis not present

## 2016-03-15 LAB — POCT INFLUENZA A/B
INFLUENZA A, POC: NEGATIVE
INFLUENZA B, POC: NEGATIVE

## 2016-03-15 MED ORDER — ALBUTEROL SULFATE HFA 108 (90 BASE) MCG/ACT IN AERS: 2.0000 | INHALATION_SPRAY | Freq: Four times a day (QID) | RESPIRATORY_TRACT | 0 refills | Status: DC | PRN

## 2016-03-15 MED ORDER — PREDNISONE 10 MG (21) PO TBPK: 10.0000 mg | ORAL_TABLET | Freq: Every day | ORAL | 0 refills | Status: DC

## 2016-03-15 MED ORDER — GUAIFENESIN-CODEINE 100-10 MG/5ML PO SYRP: 10.0000 mL | ORAL_SOLUTION | Freq: Three times a day (TID) | ORAL | 0 refills | Status: DC | PRN

## 2016-03-15 NOTE — Telephone Encounter (Signed)
Patient called stating that he needed a different inhaler that his insurance will cover.  I then called his preferred pharmacy and spoke to [place], the pharmacist, and she informed me that since he has filled 2 rx for an inhaler his insurance requires him to get it through his mail order for now on.  When I tried to call this patient back, the call went straight to his voicemail so I was not able to see how he wanted to proceed with this issue but a message was left for him to give us a call back when he got the chance.

## 2016-03-15 NOTE — Progress Notes (Signed)
Name: Marcus Zavala   MRN: [id number]    DOB: [birth year]   Date:03/15/2016       Progress Note  Subjective  Chief Complaint  Chief Complaint  Patient presents with  . Acute Visit    Possible flu    URI   This is a new problem. The current episode started in the past 7 days. There has been no fever. Associated symptoms include coughing, rhinorrhea, sneezing and wheezing. Pertinent negatives include no nausea, rash, sore throat or vomiting. Treatments tried: Normally takes Claritin every day. The treatment provided no relief.     Past Medical History:  Diagnosis Date  . Allergy   . Aortic dissection (HCC)   . Hypertension     Past Surgical History:  Procedure Laterality Date  . AORTA SURGERY  03/2009    Family History  Problem Relation Age of Onset  . Diabetes Mother   . Heart disease Father   . Cancer Father     Lymphatic  . Heart disease Brother   . Diabetes Brother     Social History   Social History  . Marital status: Married    Spouse name: N/A  . Number of children: N/A  . Years of education: N/A   Occupational History  . Not on file.   Social History Main Topics  . Smoking status: Never Smoker  . Smokeless tobacco: Never Used  . Alcohol use No  . Drug use: No  . Sexual activity: Yes    Partners: Female   Other Topics Concern  . Not on file   Social History Narrative  . No narrative on file     Current Outpatient Prescriptions:  .  amLODipine (NORVASC) 10 MG tablet, Take 1 tablet by mouth at bedtime. , Disp: , Rfl: 11 .  Azelastine HCl (ASTEPRO) 0.15 % SOLN, Place into the nose. Reported on 07/14/2015, Disp: , Rfl:  .  losartan-hydrochlorothiazide (HYZAAR) 100-25 MG per tablet, Take 1 tablet by mouth at bedtime., Disp: , Rfl:  .  Multiple Vitamin (MULTIVITAMIN) capsule, Take 1 capsule by mouth daily. Reported on 07/14/2015, Disp: , Rfl:  .  NEEDLE, DISP, 22 G 22G X 1-1/2" MISC, Reported on 07/14/2015, Disp: , Rfl:  .  testosterone cypionate  (DEPOTESTOSTERONE CYPIONATE) 200 MG/ML injection, Reported on 07/14/2015, Disp: , Rfl: 3  Allergies  Allergen Reactions  . Penicillins Hives  . Ciprofloxacin Nausea Only  . Lisinopril Swelling    Hand swelling     Review of Systems  HENT: Positive for rhinorrhea and sneezing. Negative for sore throat.   Respiratory: Positive for cough and wheezing.   Gastrointestinal: Negative for nausea and vomiting.  Skin: Negative for rash.      Objective  Vitals:   03/15/16 0953  BP: 131/75  Pulse: 88  Resp: 16  Temp: 98.7 F (37.1 C)  TempSrc: Oral  SpO2: 97%  Weight: 293 lb (132.9 kg)  Height: 6\' 3"  (1.905 m)    Physical Exam  Constitutional: He is oriented to person, place, and time and well-developed, well-nourished, and in no distress.  HENT:  Right Ear: Tympanic membrane and ear canal normal.  Left Ear: Tympanic membrane and ear canal normal.  Mouth/Throat: No oropharyngeal exudate or posterior oropharyngeal erythema.  Cardiovascular: Normal rate, regular rhythm, S1 normal, S2 normal and normal heart sounds.   No murmur heard. Pulmonary/Chest: Effort normal and breath sounds normal. No accessory muscle usage. No respiratory distress. He has no decreased breath sounds. He has no  wheezes. He has no rhonchi.  Neurological: He is alert and oriented to person, place, and time.  Nursing note and vitals reviewed.      Assessment & Plan  1. Flu-like symptoms Point-of-care influenza is negative - POCT Influenza A/B  2. Acute bronchitis, unspecified organism Appears post viral bronchitis, will start on prednisone and albuterol along with antitussive, obtain chest x-ray to rule out pneumonia - predniSONE (STERAPRED UNI-PAK 21 TAB) 10 MG (21) TBPK tablet; Take 1 tablet (10 mg total) by mouth daily. 60 50 40 30 20 10  then STOP  Dispense: 21 tablet; Refill: 0 - albuterol (PROVENTIL HFA;VENTOLIN HFA) 108 (90 Base) MCG/ACT inhaler; Inhale 2 puffs into the lungs every 6 (six) hours  as needed for wheezing or shortness of breath.  Dispense: 1 Inhaler; Refill: 0 - guaiFENesin-codeine (CHERATUSSIN AC) 100-10 MG/5ML syrup; Take 10 mLs by mouth 3 (three) times daily as needed for cough.  Dispense: 120 mL; Refill: 0 - DG Chest 2 View; Future   Tondra Reierson Asad A. [facility]aylene [facility] 03/15/2016 10:11 AM

## 2016-03-15 NOTE — Telephone Encounter (Signed)
I called back and left a voice message for this patient. If he is agreeable, we can send a prescription for the inhaler prescribed through mail order pharmacy. Please confirm

## 2016-03-16 ENCOUNTER — Ambulatory Visit
Admission: RE | Admit: 2016-03-16 | Discharge: 2016-03-16 | Disposition: A | Discharge status: Home / Self Care | Payer: 59 | Source: Ambulatory Visit | Attending: Family Medicine | Admitting: Family Medicine

## 2016-03-16 DIAGNOSIS — J40 Bronchitis, not specified as acute or chronic: Secondary | ICD-10-CM | POA: Insufficient documentation

## 2016-03-16 DIAGNOSIS — J209 Acute bronchitis, unspecified: Secondary | ICD-10-CM

## 2016-03-17 ENCOUNTER — Telehealth: Payer: Self-pay | Admitting: Family Medicine

## 2016-03-17 DIAGNOSIS — J209 Acute bronchitis, unspecified: Secondary | ICD-10-CM

## 2016-03-17 MED ORDER — AZITHROMYCIN 250 MG PO TABS: ORAL_TABLET | ORAL | 0 refills | Status: DC

## 2016-03-17 NOTE — Telephone Encounter (Signed)
Patient stated he is no better having sinus headaches and pressure.

## 2016-03-17 NOTE — Telephone Encounter (Signed)
Prescription for Z-Pak has been sent to patient's pharmacy, has been advised to start taking tonight.

## 2016-03-17 NOTE — Telephone Encounter (Signed)
PT ASKING FOR RESULTS OF CHEST XRAY AND ALSO DR Clelia CroftSHAW TOLD HIM TO CALL HIM BACK IF HE IS NO BETTER. HE SAID THAT HE IS NO BETTER. SAYS THAT HIS NOSE IS SO STOPPED UP.

## 2016-03-22 ENCOUNTER — Ambulatory Visit: Payer: 59 | Admitting: Family Medicine

## 2016-04-07 ENCOUNTER — Encounter: Payer: Self-pay | Admitting: Family Medicine

## 2016-04-07 ENCOUNTER — Ambulatory Visit (INDEPENDENT_AMBULATORY_CARE_PROVIDER_SITE_OTHER): Payer: 59 | Admitting: Family Medicine

## 2016-04-07 VITALS — BP 142/76 | HR 86 | Temp 97.9°F | Resp 18 | Ht 75.0 in | Wt 289.8 lb

## 2016-04-07 DIAGNOSIS — G4730 Sleep apnea, unspecified: Secondary | ICD-10-CM | POA: Insufficient documentation

## 2016-04-07 DIAGNOSIS — J0100 Acute maxillary sinusitis, unspecified: Secondary | ICD-10-CM | POA: Diagnosis not present

## 2016-04-07 MED ORDER — MOMETASONE FUROATE 50 MCG/ACT NA SUSP: 2.0000 | Freq: Every day | NASAL | 0 refills | Status: DC

## 2016-04-07 MED ORDER — DOXYCYCLINE HYCLATE 100 MG PO TABS: 100.0000 mg | ORAL_TABLET | Freq: Two times a day (BID) | ORAL | 0 refills | Status: DC

## 2016-04-07 NOTE — Progress Notes (Signed)
Name: Marcus Zavala   MRN: [id number]    DOB: 02-10-1971   Date:04/07/2016       Progress Note  Subjective  Chief Complaint  Chief Complaint  Patient presents with  . Sinusitis  . Ear Fullness    feels like ears are popping   Sinusitis  This is a new problem. The current episode started in the past 7 days (1-2 weeks ago). There has been no fever. Associated symptoms include congestion, coughing, ear pain (right ear pops when he swallows), headaches, sinus pressure and sneezing. Pertinent negatives include no sore throat. Past treatments include acetaminophen.  Ear Fullness   There is pain in the right ear. This is a new problem. The current episode started 1 to 4 weeks ago. There has been no fever. Associated symptoms include coughing and headaches. Pertinent negatives include no sore throat. He has tried acetaminophen for the symptoms. The treatment provided mild relief.     Past Medical History:  Diagnosis Date  . Allergy   . Aortic dissection (HCC)   . Hypertension     Past Surgical History:  Procedure Laterality Date  . AORTA SURGERY  03/2009    Family History  Problem Relation Age of Onset  . Diabetes Mother   . Heart disease Father   . Cancer Father     Lymphatic  . Heart disease Brother   . Diabetes Brother     Social History   Social History  . Marital status: Married    Spouse name: N/A  . Number of children: N/A  . Years of education: N/A   Occupational History  . Not on file.   Social History Main Topics  . Smoking status: Never Smoker  . Smokeless tobacco: Never Used  . Alcohol use No  . Drug use: No  . Sexual activity: Yes    Partners: Female   Other Topics Concern  . Not on file   Social History Narrative  . No narrative on file     Current Outpatient Prescriptions:  .  albuterol (PROVENTIL HFA;VENTOLIN HFA) 108 (90 Base) MCG/ACT inhaler, Inhale 2 puffs into the lungs every 6 (six) hours as needed for wheezing or shortness of breath.,  Disp: 1 Inhaler, Rfl: 0 .  amLODipine (NORVASC) 10 MG tablet, Take 1 tablet by mouth at bedtime. , Disp: , Rfl: 11 .  Azelastine HCl (ASTEPRO) 0.15 % SOLN, Place into the nose. Reported on 07/14/2015, Disp: , Rfl:  .  losartan-hydrochlorothiazide (HYZAAR) 100-25 MG per tablet, Take 1 tablet by mouth at bedtime., Disp: , Rfl:  .  Multiple Vitamin (MULTIVITAMIN) capsule, Take 1 capsule by mouth daily. Reported on 07/14/2015, Disp: , Rfl:  .  NEEDLE, DISP, 22 G 22G X 1-1/2" MISC, Reported on 07/14/2015, Disp: , Rfl:  .  testosterone cypionate (DEPOTESTOSTERONE CYPIONATE) 200 MG/ML injection, Reported on 07/14/2015, Disp: , Rfl: 3  Allergies  Allergen Reactions  . Penicillins Hives  . Ciprofloxacin Nausea Only  . Lisinopril Swelling    Hand swelling     Review of Systems  HENT: Positive for congestion, ear pain (right ear pops when he swallows), sinus pressure and sneezing. Negative for sore throat.   Respiratory: Positive for cough.   Neurological: Positive for headaches.     Objective  Vitals:   04/07/16 1006  BP: (!) 142/76  Pulse: 86  Resp: 18  Temp: 97.9 F (36.6 C)  TempSrc: Oral  SpO2: 97%  Weight: 289 lb 12.8 oz (131.5 kg)  Height:  6\' 3"  (1.905 m)    Physical Exam  Constitutional: He is oriented to person, place, and time and well-developed, well-nourished, and in no distress.  HENT:  Nose: Right sinus exhibits maxillary sinus tenderness. Left sinus exhibits maxillary sinus tenderness.    Mouth/Throat: Posterior oropharyngeal erythema present.  Nasal turbinate hypertrophy, mucosal inflammation Right TM gray, no edema, drainage, canal is clear.  Left ear canal normal  Cardiovascular: Normal rate, S1 normal, S2 normal and normal heart sounds.   No murmur heard. Pulmonary/Chest: Effort normal and breath sounds normal.  Neurological: He is alert and oriented to person, place, and time.  Psychiatric: Mood, memory, affect and judgment normal.  Nursing note and vitals  reviewed.     Assessment & Plan  1. Sleep apnea in adult Patient has history of sleep apnea and uses CPAP, requesting referral to a local sleep medicine specialist. - Ambulatory referral to Sleep Studies  2. Acute non-recurrent maxillary sinusitis By history and exam, start on doxycycline, DC Flonase and use Nasonex as prescribed. - doxycycline (VIBRA-TABS) 100 MG tablet; Take 1 tablet (100 mg total) by mouth 2 (two) times daily.  Dispense: 20 tablet; Refill: 0 - mometasone (NASONEX) 50 MCG/ACT nasal spray; Place 2 sprays into the nose daily.  Dispense: 17 g; Refill: 0   Courtnee Myer Asad A. [facility] 04/07/2016 10:17 AM

## 2016-04-22 ENCOUNTER — Ambulatory Visit (INDEPENDENT_AMBULATORY_CARE_PROVIDER_SITE_OTHER): Payer: 59 | Admitting: Family Medicine

## 2016-04-22 ENCOUNTER — Encounter: Payer: Self-pay | Admitting: Family Medicine

## 2016-04-22 VITALS — BP 138/80 | HR 84 | Temp 98.0°F | Resp 16 | Ht 75.0 in | Wt 282.8 lb

## 2016-04-22 DIAGNOSIS — J0111 Acute recurrent frontal sinusitis: Secondary | ICD-10-CM | POA: Diagnosis not present

## 2016-04-22 NOTE — Progress Notes (Signed)
Name: Marcus Zavala   MRN: [id number]    DOB: January 20, 1971   Date:04/22/2016       Progress Note  Subjective  Chief Complaint  Chief Complaint  Patient presents with  . Ear Problem    Rt ear popping    HPI  Pt. Presents w/ recurrent symptoms of popping in the right ear and sinus pressure, he was seen last week for similar symptoms and started on antibiotics and nasal spray. He reports no improvement on the prescribed medications. Still having sinus headaches, sneezing, and pressure in the ear.   Past Medical History:  Diagnosis Date  . Allergy   . Aortic dissection (HCC)   . Hypertension     Past Surgical History:  Procedure Laterality Date  . AORTA SURGERY  03/2009    Family History  Problem Relation Age of Onset  . Diabetes Mother   . Heart disease Father   . Cancer Father     Lymphatic  . Heart disease Brother   . Diabetes Brother     Social History   Social History  . Marital status: Married    Spouse name: N/A  . Number of children: N/A  . Years of education: N/A   Occupational History  . Not on file.   Social History Main Topics  . Smoking status: Never Smoker  . Smokeless tobacco: Never Used  . Alcohol use No  . Drug use: No  . Sexual activity: Yes    Partners: Female   Other Topics Concern  . Not on file   Social History Narrative  . No narrative on file     Current Outpatient Prescriptions:  .  albuterol (PROVENTIL HFA;VENTOLIN HFA) 108 (90 Base) MCG/ACT inhaler, Inhale 2 puffs into the lungs every 6 (six) hours as needed for wheezing or shortness of breath., Disp: 1 Inhaler, Rfl: 0 .  amLODipine (NORVASC) 10 MG tablet, Take 1 tablet by mouth at bedtime. , Disp: , Rfl: 11 .  losartan-hydrochlorothiazide (HYZAAR) 100-25 MG per tablet, Take 1 tablet by mouth at bedtime., Disp: , Rfl:  .  mometasone (NASONEX) 50 MCG/ACT nasal spray, Place 2 sprays into the nose daily., Disp: 17 g, Rfl: 0 .  Multiple Vitamin (MULTIVITAMIN) capsule, Take 1  capsule by mouth daily. Reported on 07/14/2015, Disp: , Rfl:  .  NEEDLE, DISP, 22 G 22G X 1-1/2" MISC, Reported on 07/14/2015, Disp: , Rfl:  .  testosterone cypionate (DEPOTESTOSTERONE CYPIONATE) 200 MG/ML injection, Reported on 07/14/2015, Disp: , Rfl: 3  Allergies  Allergen Reactions  . Penicillins Hives  . Ciprofloxacin Nausea Only  . Lisinopril Swelling    Hand swelling     Review of Systems  Constitutional: Negative for chills and fever.  HENT: Positive for congestion and sinus pain. Negative for sore throat.   Respiratory: Negative for cough.     Objective  Vitals:   04/22/16 1141  BP: 138/80  Pulse: 84  Resp: 16  Temp: 98 F (36.7 C)  TempSrc: Oral  SpO2: 98%  Weight: 282 lb 12.8 oz (128.3 kg)  Height: 6\' 3"  (1.905 m)    Physical Exam  Constitutional: He is oriented to person, place, and time and well-developed, well-nourished, and in no distress.  HENT:  Head: Normocephalic and atraumatic.  Right Ear: Tympanic membrane and ear canal normal. No drainage, swelling or tenderness.  Left Ear: Tympanic membrane and ear canal normal. No drainage, swelling or tenderness.  Nose: Right sinus exhibits frontal sinus tenderness. Left sinus exhibits  frontal sinus tenderness.  Mouth/Throat: No posterior oropharyngeal erythema.  Nasal turbinates mildly hypertrophied, mucosal inflammation.  Cardiovascular: Normal rate, regular rhythm and normal heart sounds.   No murmur heard. Pulmonary/Chest: Effort normal and breath sounds normal. He has no wheezes.  Neurological: He is alert and oriented to person, place, and time.  Nursing note and vitals reviewed.   Assessment & Plan  1. Acute recurrent frontal sinusitis Persistent symptoms including sinus pressure and a popping sensation in the right ear, will refer to ENT for further management, advised to take Allegra for possible allergies. - Ambulatory referral to ENT   Jakirah Zaun [facility] 04/22/2016 11:57 AM

## 2016-04-25 ENCOUNTER — Encounter: Payer: Self-pay | Admitting: Family Medicine

## 2016-06-28 ENCOUNTER — Ambulatory Visit (INDEPENDENT_AMBULATORY_CARE_PROVIDER_SITE_OTHER): Payer: 59 | Admitting: Family Medicine

## 2016-06-28 ENCOUNTER — Encounter: Payer: Self-pay | Admitting: Family Medicine

## 2016-06-28 VITALS — BP 136/81 | HR 89 | Temp 98.0°F | Resp 17 | Ht 75.0 in | Wt 278.7 lb

## 2016-06-28 DIAGNOSIS — J0111 Acute recurrent frontal sinusitis: Secondary | ICD-10-CM | POA: Diagnosis not present

## 2016-06-28 DIAGNOSIS — E119 Type 2 diabetes mellitus without complications: Secondary | ICD-10-CM

## 2016-06-28 HISTORY — DX: Type 2 diabetes mellitus without complications: E11.9

## 2016-06-28 LAB — POCT GLYCOSYLATED HEMOGLOBIN (HGB A1C): Hemoglobin A1C: 6.6

## 2016-06-28 MED ORDER — PREDNISONE 10 MG (21) PO TBPK: ORAL_TABLET | ORAL | 0 refills | Status: DC

## 2016-06-28 MED ORDER — AZITHROMYCIN 250 MG PO TABS: ORAL_TABLET | ORAL | 0 refills | Status: DC

## 2016-06-28 NOTE — Progress Notes (Signed)
Name: Marcus Zavala   MRN: [id number]    DOB: 20-Jun-1970   Date:06/28/2016       Progress Note  Subjective  Chief Complaint  Chief Complaint  Patient presents with  . Sinus Problem    Sinus Problem  This is a new problem. The current episode started in the past 7 days. There has been no fever. Associated symptoms include congestion and sinus pressure. Pertinent negatives include no coughing or sore throat. Past treatments include antibiotics and spray decongestants (has tried antibiotics and nasal spray, has not worked, has been taking Tylenol which is moderately effective.).  Diabetes  He presents for his follow-up diabetic visit. He has type 2 diabetes mellitus. His disease course has been stable. There are no hypoglycemic associated symptoms. Associated symptoms include fatigue. Pertinent negatives for diabetes include no foot paresthesias, no polydipsia and no polyuria. Pertinent negatives for diabetic complications include no CVA or heart disease. Current diabetic treatment includes diet. He is following a generally healthy diet. When asked about meal planning, he reported none. Home blood sugar record trend: does not check his BG.    Past Medical History:  Diagnosis Date  . Allergy   . Aortic dissection (HCC)   . Hypertension     Past Surgical History:  Procedure Laterality Date  . AORTA SURGERY  03/2009    Family History  Problem Relation Age of Onset  . Diabetes Mother   . Heart disease Father   . Cancer Father     Lymphatic  . Heart disease Brother   . Diabetes Brother     Social History   Social History  . Marital status: Married    Spouse name: N/A  . Number of children: N/A  . Years of education: N/A   Occupational History  . Not on file.   Social History Main Topics  . Smoking status: Never Smoker  . Smokeless tobacco: Never Used  . Alcohol use No  . Drug use: No  . Sexual activity: Yes    Partners: Female   Other Topics Concern  . Not on file    Social History Narrative  . No narrative on file     Current Outpatient Prescriptions:  .  amLODipine (NORVASC) 10 MG tablet, Take 1 tablet by mouth at bedtime. , Disp: , Rfl: 11 .  losartan-hydrochlorothiazide (HYZAAR) 100-25 MG per tablet, Take 1 tablet by mouth at bedtime., Disp: , Rfl:  .  mometasone (NASONEX) 50 MCG/ACT nasal spray, Place 2 sprays into the nose daily., Disp: 17 g, Rfl: 0 .  Multiple Vitamin (MULTIVITAMIN) capsule, Take 1 capsule by mouth daily. Reported on 07/14/2015, Disp: , Rfl:  .  NEEDLE, DISP, 22 G 22G X 1-1/2" MISC, Reported on 07/14/2015, Disp: , Rfl:  .  testosterone cypionate (DEPOTESTOSTERONE CYPIONATE) 200 MG/ML injection, Reported on 07/14/2015, Disp: , Rfl: 3  Allergies  Allergen Reactions  . Penicillins Hives  . Ciprofloxacin Nausea Only  . Lisinopril Swelling    Hand swelling     Review of Systems  Constitutional: Positive for fatigue.  HENT: Positive for congestion and sinus pressure. Negative for sore throat.   Respiratory: Negative for cough.   Endo/Heme/Allergies: Negative for polydipsia.     Objective  Vitals:   06/28/16 1033  BP: 136/81  Pulse: 89  Resp: 17  Temp: 98 F (36.7 C)  TempSrc: Oral  SpO2: 97%  Weight: 278 lb 11.2 oz (126.4 kg)  Height:  (1.905 m)    Physical Exam  Constitutional: He is oriented to person, place, and time and well-developed, well-nourished, and in no distress.  HENT:  Head: Normocephalic and atraumatic.  Right Ear: Tympanic membrane and ear canal normal. No drainage or swelling.  Left Ear: Tympanic membrane and ear canal normal. No drainage or swelling.  Nose: Right sinus exhibits frontal sinus tenderness. Right sinus exhibits no maxillary sinus tenderness. Left sinus exhibits frontal sinus tenderness. Left sinus exhibits no maxillary sinus tenderness.  Mouth/Throat: No posterior oropharyngeal edema or posterior oropharyngeal erythema.  Cardiovascular: Normal rate, regular rhythm and normal  heart sounds.   No murmur heard. Pulmonary/Chest: Effort normal and breath sounds normal. He has no wheezes.  Neurological: He is alert and oriented to person, place, and time.  Psychiatric: Mood, memory, affect and judgment normal.  Nursing note and vitals reviewed.       Assessment & Plan  1. Controlled type 2 diabetes mellitus without complication, without long-term current use of insulin (HCC)   A1c 6.6%, well-controlled diabetes, no pharmacotherapy for now, reassess in 3 months - POCT glycosylated hemoglobin (Hb A1C)  2. Acute recurrent frontal sinusitis He is expected to have surgery for repair of sinuses in June 18, started on tapering dose of prednisone and 5 day course of azithromycin, follow-up with ENT - predniSONE (STERAPRED UNI-PAK 21 TAB) 10 MG (21) TBPK tablet; 60 50 40 then STOP  Dispense: 21 tablet; Refill: 0 - azithromycin (ZITHROMAX) 250 MG tablet; 2 tabs po day 1, then 1 tab po q day x 4 days  Dispense: 6 each; Refill: 0   Eulah Walkup [facility] 06/28/2016 10:44 AM

## 2016-06-30 ENCOUNTER — Telehealth: Payer: Self-pay | Admitting: Family Medicine

## 2016-06-30 DIAGNOSIS — I71019 Dissection of thoracic aorta, unspecified: Secondary | ICD-10-CM

## 2016-06-30 DIAGNOSIS — I7101 Dissection of thoracic aorta: Secondary | ICD-10-CM

## 2016-06-30 NOTE — Telephone Encounter (Signed)
Pt would like to know if he could get a referrel to Dr Melvia Heaps @ [facility] for his Aortic. [phone] is their phone number.

## 2016-06-30 NOTE — Telephone Encounter (Signed)
Referral to Dr. Bennie Pierini at [facility] is placed

## 2016-07-03 DIAGNOSIS — I7771 Dissection of carotid artery: Secondary | ICD-10-CM | POA: Insufficient documentation

## 2016-07-03 DIAGNOSIS — R002 Palpitations: Secondary | ICD-10-CM | POA: Insufficient documentation

## 2016-07-03 DIAGNOSIS — Z8249 Family history of ischemic heart disease and other diseases of the circulatory system: Secondary | ICD-10-CM | POA: Insufficient documentation

## 2016-07-03 DIAGNOSIS — I351 Nonrheumatic aortic (valve) insufficiency: Secondary | ICD-10-CM | POA: Insufficient documentation

## 2016-07-23 DIAGNOSIS — R600 Localized edema: Secondary | ICD-10-CM | POA: Insufficient documentation

## 2016-10-04 NOTE — Anesthesia Preprocedure Evaluation (Addendum)
Anesthesia Evaluation  Patient identified by MRN, date of birth, ID band Patient awake    Reviewed: Allergy & Precautions, NPO status , Patient's Chart, lab work & pertinent test results  Airway Mallampati: II  TM Distance: >3 FB     Dental   Pulmonary sleep apnea ,  Uses tobacco dip   Pulmonary exam normal        Cardiovascular hypertension, + Peripheral Vascular Disease (aortic dissection s/p repair 2011)  Normal cardiovascular exam+ Valvular Problems/Murmurs AI   CT TAA 08/31/16:  1. Stable appearance of the thoracoabdominal aorta status post open repair of the ascending aorta with a persistent dissection extending along the aortic arch and descending aorta and the abdominal aorta and through the midportion of the left external iliac artery. Proximal descending aorta, infrarenal abdominal aorta and left common iliac artery remain enlarged and are not significantly changed from prior.  US aorta 08/31/16:  The largest AP measurement was 4.69 cm in  maximum diameter.    Neuro/Psych    GI/Hepatic negative GI ROS, Neg liver ROS,   Endo/Other  diabetesMorbid obesity (BMI 35)  Renal/GU negative Renal ROS     Musculoskeletal negative musculoskeletal ROS (+)   Abdominal   Peds  Hematology   Anesthesia Other Findings From cardiology note 07/20/16:  Marcus GriffinsMark Zavala is a 46 y.o. male with known Thoracoabdominal Aortic Dissection s/p Ascending Aortic repair 2011, Mild AR, HTN and OSA who presents today for a follow-up visit.   1. R>L LE Edema-called yesterday [place] standing on his feet for at least 10 hours a day. He started noticing noting right greater than left edema just above the ankle that was tender to touch. The edema did not resolve overnight. He denies any right leg trauma. He has been taking amlodipine for many years and has never had lower extremity  edema.Recommendations:  A. Bilat LE Venous Duplex today B. Salt restriction C. If venous duplex negative, consider PCP evaluation for M/S cause of leg edema. D. We may consider reducing amlodipine if Venous Duplex is negative.   2. 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 His BP is well controlled. He is not smoking but dips tobacco. He denies any abdominal/ back pain. There is no pulsatile abdominal mass on exam. Recommendations: A. Monitor BP at home. Contact HCC should BP consistently exceed 140/90 or if SBP < 100 mmHg B. Encouraged to stop tobacco use.  C. Keep upcoming appt w/ Vascular Surgery re: AAA and L. CIA aneurysm  2. HTN & Mild AR/Family Hx Premature CAD-He denies anginal symptoms. He takes all of his medications in the at bedtime.He reports his blood pressure at home typically runs 130/70. He is requesting that we combine his current HTN medicines(amlodipine, losartan, HCTZ) into one medication. He had a negative nuclear stress test 2011. Requesting cardiac risk assessment prior to sinus surgery June 4. He has no HF on exam. BP/HR are controlled. Weight is stable. Recommendations: A. Restrict salt in diet (limit to no more than 2000mg /day).  B. Monitor BP at home. Contact HCC should BP consistently exceed 140/90 or if SBP < 100 mmHg C. Continue CV med(s):  Beta Blockers: no listed allergy, consider using if HR remains elevated  ACE Inhibitor/ARB with diuretic: Lisinopril caused hand swelling in the past, Hyzaar  100/25 mg 1 tab daily-to stop to start Exforge  CCB: Amlodipine 10 mg daily-to stop to start Exforge   CCB/ARB/Diuretic: start Exforge HCT (10/160/25 milligrams) 1 tab daily F. As above, encouraged to stop tobacco use  3. Carotid Artery Dissection- He denies any  stroke, TIA or amaurosis fugax symptoms. His neuro exam is normal. He has no evidence of cervical bruit. He denies any upper extremity claudication or symptoms of subclavian steal. He has equal upper extremity pulses. Review of scanned records show he had an upper extremity arterial duplex in 2011 showing dissection of the right CCA from the brachiocephalic trunk to the mid CCA. Recommendations: A. When next CT performed, ask to comment on R. CCA dissection.   Reproductive/Obstetrics negative OB ROS                            Anesthesia Physical Anesthesia Plan  ASA: III  Anesthesia Plan: General   Post-op Pain Management:    Induction: Intravenous  PONV Risk Score and Plan: 1 and Ondansetron and Dexamethasone  Airway Management Planned: Oral ETT  Additional Equipment:   Intra-op Plan:   Post-operative Plan: Extubation in OR  Informed Consent: I have reviewed the patients History and Physical, chart, labs and discussed the procedure including the risks, benefits and alternatives for the proposed anesthesia with the patient or authorized representative who has indicated his/her understanding and acceptance.   Dental advisory given  Plan Discussed with:   Anesthesia Plan Comments:        Anesthesia Quick Evaluation

## 2016-10-10 NOTE — Discharge Instructions (Signed)
[facility] °[facility] °ENDOSCOPIC SINUS SURGERY °[facility] ° °What is Functional Endoscopic Sinus Surgery? ° The Surgery involves making the natural openings of the sinuses larger by removing the bony partitions that separate the sinuses from the nasal cavity.  The natural sinus lining is preserved as much as possible to allow the sinuses to resume normal function after the surgery.  In some patients nasal polyps (excessively swollen lining of the sinuses) may be removed to relieve obstruction of the sinus openings.  The surgery is performed through the nose using lighted scopes, which eliminates the need for incisions on the face.  A septoplasty is a different procedure which is sometimes performed with sinus surgery.  It involves straightening the boy partition that separates the two sides of your nose.  A crooked or deviated septum may need repair if is obstructing the sinuses or nasal airflow.  Turbinate reduction is also often performed during sinus surgery.  The turbinates are bony proturberances from the side walls of the nose which swell and can obstruct the nose in patients with sinus and allergy problems.  Their size can be surgically reduced to help relieve nasal obstruction. ° °What Can Sinus Surgery Do For Me? ° Sinus surgery can reduce the frequency of sinus infections requiring antibiotic treatment.  This can provide improvement in nasal congestion, post-nasal drainage, facial pressure and nasal obstruction.  Surgery will NOT prevent you from ever having an infection again, so it usually only for patients who get infections 4 or more times yearly requiring antibiotics, or for infections that do not clear with antibiotics.  It will not cure nasal allergies, so patients with allergies may still require medication to treat their allergies after surgery. Surgery may improve headaches related to sinusitis, however, some people will continue to  require medication to control sinus headaches related to allergies.  Surgery will do nothing for other forms of headache (migraine, tension or cluster). ° °What Are the Risks of Endoscopic Sinus Surgery? ° Current techniques allow surgery to be performed safely with little risk, however, there are rare complications that patients should be aware of.  Because the sinuses are located around the eyes, there is risk of eye injury, including blindness, though again, this would be quite rare. This is usually a result of bleeding behind the eye during surgery, which puts the vision oat risk, though there are treatments to protect the vision and prevent permanent disrupted by surgery causing a leak of the spinal fluid that surrounds the brain.  More serious complications would include bleeding inside the brain cavity or damage to the brain.  Again, all of these complications are uncommon, and spinal fluid leaks can be safely managed surgically if they occur.  The most common complication of sinus surgery is bleeding from the nose, which may require packing or cauterization of the nose.  Continued sinus have polyps may experience recurrence of the polyps requiring revision surgery.  Alterations of sense of smell or injury to the tear ducts are also rare complications.  ° °What is the Surgery Like, and what is the Recovery? ° The Surgery usually takes a couple of hours to perform, and is usually performed under a general anesthetic (completely asleep).  Patients are usually discharged home after a couple of hours.  Sometimes during surgery it is necessary to pack the nose to control bleeding, and the packing is left in place for 24 - 48 hours, and removed by your surgeon.    If a septoplasty was performed during the procedure, there is often a splint placed which must be removed after 5-7 days.   °Discomfort: Pain is usually mild to moderate, and can be controlled by prescription pain medication or acetaminophen (Tylenol).   Aspirin, Ibuprofen (Advil, Motrin), or Naprosyn (Aleve) should be avoided, as they can cause increased bleeding.  Most patients feel sinus pressure like they have a bad head cold for several days.  Sleeping with your head elevated can help reduce swelling and facial pressure, as can ice packs over the face.  A humidifier may be helpful to keep the mucous and blood from drying in the nose.  ° °Diet: There are no specific diet restrictions, however, you should generally start with clear liquids and a light diet of bland foods because the anesthetic can cause some nausea.  Advance your diet depending on how your stomach feels.  Taking your pain medication with food will often help reduce stomach upset which pain medications can cause. ° °Nasal Saline Irrigation: It is important to remove blood clots and dried mucous from the nose as it is healing.  This is done by having you irrigate the nose at least 3 - 4 times daily with a salt water solution.  We recommend using NeilMed Sinus Rinse (available at the drug store).  Fill the squeeze bottle with the solution, bend over a sink, and insert the tip of the squeeze bottle into the nose ½ of an inch.  Point the tip of the squeeze bottle towards the inside corner of the eye on the same side your irrigating.  Squeeze the bottle and gently irrigate the nose.  If you bend forward as you do this, most of the fluid will flow back out of the nose, instead of down your throat.   The solution should be warm, near body temperature, when you irrigate.   Each time you irrigate, you should use a full squeeze bottle.  ° °Note that if you are instructed to use Nasal Steroid Sprays at any time after your surgery, irrigate with saline BEFORE using the steroid spray, so you do not wash it all out of the nose. °Another product, Nasal Saline Gel (such as AYR Nasal Saline Gel) can be applied in each nostril 3 - 4 times daily to moisture the nose and reduce scabbing or crusting. ° °Bleeding:   Bloody drainage from the nose can be expected for several days, and patients are instructed to irrigate their nose frequently with salt water to help remove mucous and blood clots.  The drainage may be dark red or brown, though some fresh blood may be seen intermittently, especially after irrigation.  Do not blow you nose, as bleeding may occur. If you must sneeze, keep your mouth open to allow air to escape through your mouth. ° °If heavy bleeding occurs: Irrigate the nose with saline to rinse out clots, then spray the nose 3 - 4 times with Afrin Nasal Decongestant Spray.  The spray will constrict the blood vessels to slow bleeding.  Pinch the lower half of your nose shut to apply pressure, and lay down with your head elevated.  Ice packs over the nose may help as well. If bleeding persists despite these measures, you should notify your doctor.  Do not use the Afrin routinely to control nasal congestion after surgery, as it can result in worsening congestion and may affect healing.  ° ° ° °Activity: Return to work varies among patients. Most patients will be   out of work at least 5 - 7 days to recover.  Patient may return to work after they are off of narcotic pain medication, and feeling well enough to perform the functions of their job.  Patients must avoid heavy lifting (over 10 pounds) or strenuous physical for 2 weeks after surgery, so your employer may need to assign you to light duty, or keep you out of work longer if light duty is not possible.  NOTE: you should not drive, operate dangerous machinery, do any mentally demanding tasks or make any important legal or financial decisions while on narcotic pain medication and recovering from the general anesthetic.  °  °Call Your Doctor Immediately if You Have Any of the Following: °1. Bleeding that you cannot control with the above measures °2. Loss of vision, double vision, bulging of the eye or black eyes. °3. Fever over 101 degrees °4. Neck stiffness with  severe headache, fever, nausea and change in mental state. °You are always encourage to call anytime with concerns, however, please call with requests for pain medication refills during office hours. ° °Office Endoscopy: During follow-up visits your doctor will remove any packing or splints that may have been placed and evaluate and clean your sinuses endoscopically.  Topical anesthetic will be used to make this as comfortable as possible, though you may want to take your pain medication prior to the visit.  How often this will need to be done varies from patient to patient.  After complete recovery from the surgery, you may need follow-up endoscopy from time to time, particularly if there is concern of recurrent infection or nasal polyps. ° ° °General Anesthesia, Adult, Care After °These instructions provide you with information about caring for yourself after your procedure. Your health care provider may also give you more specific instructions. Your treatment has been planned according to current medical practices, but problems sometimes occur. Call your health care provider if you have any problems or questions after your procedure. °What can I expect after the procedure? °After the procedure, it is common to have: °· Vomiting. °· A sore throat. °· Mental slowness. ° °It is common to feel: °· Nauseous. °· Cold or shivery. °· Sleepy. °· Tired. °· Sore or achy, even in parts of your body where you did not have surgery. ° °Follow these instructions at home: °For at least 24 hours after the procedure: °· Do not: °? Participate in activities where you could fall or become injured. °? Drive. °? Use heavy machinery. °? Drink alcohol. °? Take sleeping pills or medicines that cause drowsiness. °? Make important decisions or sign legal documents. °? Take care of children on your own. °· Rest. °Eating and drinking °· If you vomit, drink water, juice, or soup when you can drink without vomiting. °· Drink enough fluid to  keep your urine clear or pale yellow. °· Make sure you have little or no nausea before eating solid foods. °· Follow the diet recommended by your health care provider. °General instructions °· Have a responsible adult stay with you until you are awake and alert. °· Return to your normal activities as told by your health care provider. Ask your health care provider what activities are safe for you. °· Take over-the-counter and prescription medicines only as told by your health care provider. °· If you smoke, do not smoke without supervision. °· Keep all follow-up visits as told by your health care provider. This is important. °Contact a health care provider if: °· You   continue to have nausea or vomiting at home, and medicines are not helpful. °· You cannot drink fluids or start eating again. °· You cannot urinate after 8-12 hours. °· You develop a skin rash. °· You have fever. °· You have increasing redness at the site of your procedure. °Get help right away if: °· You have difficulty breathing. °· You have chest pain. °· You have unexpected bleeding. °· You feel that you are having a life-threatening or urgent problem. °This information is not intended to replace advice given to you by your health care provider. Make sure you discuss any questions you have with your health care provider. °Document Released: 05/30/2000 Document Revised: 07/27/2015 Document Reviewed: 02/05/2015 °Elsevier Interactive Patient Education © 2018 [employer] ° °

## 2016-10-14 ENCOUNTER — Ambulatory Visit: Payer: 59 | Admitting: Anesthesiology

## 2016-10-14 ENCOUNTER — Encounter
Admission: RE | Disposition: A | Discharge status: Home / Self Care | Payer: Self-pay | Source: Ambulatory Visit | Attending: Unknown Physician Specialty

## 2016-10-14 ENCOUNTER — Ambulatory Visit
Admission: RE | Admit: 2016-10-14 | Discharge: 2016-10-14 | Disposition: A | Discharge status: Home / Self Care | Payer: 59 | Source: Ambulatory Visit | Attending: Unknown Physician Specialty | Admitting: Unknown Physician Specialty

## 2016-10-14 DIAGNOSIS — Z6835 Body mass index (BMI) 35.0-35.9, adult: Secondary | ICD-10-CM | POA: Insufficient documentation

## 2016-10-14 DIAGNOSIS — J343 Hypertrophy of nasal turbinates: Secondary | ICD-10-CM | POA: Insufficient documentation

## 2016-10-14 DIAGNOSIS — E119 Type 2 diabetes mellitus without complications: Secondary | ICD-10-CM | POA: Diagnosis not present

## 2016-10-14 DIAGNOSIS — J322 Chronic ethmoidal sinusitis: Secondary | ICD-10-CM | POA: Diagnosis not present

## 2016-10-14 DIAGNOSIS — J321 Chronic frontal sinusitis: Secondary | ICD-10-CM | POA: Diagnosis not present

## 2016-10-14 DIAGNOSIS — Z79899 Other long term (current) drug therapy: Secondary | ICD-10-CM | POA: Diagnosis not present

## 2016-10-14 DIAGNOSIS — F17290 Nicotine dependence, other tobacco product, uncomplicated: Secondary | ICD-10-CM | POA: Diagnosis not present

## 2016-10-14 DIAGNOSIS — G4730 Sleep apnea, unspecified: Secondary | ICD-10-CM | POA: Insufficient documentation

## 2016-10-14 DIAGNOSIS — J320 Chronic maxillary sinusitis: Secondary | ICD-10-CM | POA: Insufficient documentation

## 2016-10-14 DIAGNOSIS — I10 Essential (primary) hypertension: Secondary | ICD-10-CM | POA: Insufficient documentation

## 2016-10-14 DIAGNOSIS — J342 Deviated nasal septum: Secondary | ICD-10-CM | POA: Diagnosis not present

## 2016-10-14 DIAGNOSIS — I739 Peripheral vascular disease, unspecified: Secondary | ICD-10-CM | POA: Insufficient documentation

## 2016-10-14 HISTORY — PX: ETHMOIDECTOMY: SHX5197

## 2016-10-14 HISTORY — PX: MAXILLARY ANTROSTOMY: SHX2003

## 2016-10-14 HISTORY — PX: SEPTOPLASTY: SHX2393

## 2016-10-14 HISTORY — PX: NASAL TURBINATE REDUCTION: SHX2072

## 2016-10-14 HISTORY — PX: SINUSOTOMY: SHX291

## 2016-10-14 HISTORY — PX: IMAGE GUIDED SINUS SURGERY: SHX6570

## 2016-10-14 HISTORY — DX: Sleep apnea, unspecified: G47.30

## 2016-10-14 SURGERY — SINUS SURGERY, WITH IMAGING GUIDANCE
Anesthesia: General | Laterality: Right | Wound class: Clean Contaminated

## 2016-10-14 MED ORDER — OXYCODONE-ACETAMINOPHEN 5-325 MG PO TABS: 1.0000 | ORAL_TABLET | ORAL | 0 refills | Status: DC | PRN

## 2016-10-14 MED ORDER — MIDAZOLAM HCL 5 MG/5ML IJ SOLN
INTRAMUSCULAR | Status: DC | PRN
  Administered 2016-10-14: 2 mg via INTRAVENOUS

## 2016-10-14 MED ORDER — OXYMETAZOLINE HCL 0.05 % NA SOLN
1.0000 | Freq: Two times a day (BID) | NASAL | Status: DC
  Administered 2016-10-14: 1 via NASAL

## 2016-10-14 MED ORDER — PROPOFOL 10 MG/ML IV BOLUS
INTRAVENOUS | Status: DC | PRN
  Administered 2016-10-14: 200 mg via INTRAVENOUS

## 2016-10-14 MED ORDER — LIDOCAINE-EPINEPHRINE 1 %-1:100000 IJ SOLN
INTRAMUSCULAR | Status: DC | PRN
  Administered 2016-10-14: 6 mL

## 2016-10-14 MED ORDER — FENTANYL CITRATE (PF) 100 MCG/2ML IJ SOLN
INTRAMUSCULAR | Status: DC | PRN
Start: 2016-10-14 — End: 2016-10-14
  Administered 2016-10-14: 100 ug via INTRAVENOUS

## 2016-10-14 MED ORDER — SUCCINYLCHOLINE CHLORIDE 20 MG/ML IJ SOLN
INTRAMUSCULAR | Status: DC | PRN
  Administered 2016-10-14: 100 mg via INTRAVENOUS

## 2016-10-14 MED ORDER — FENTANYL CITRATE (PF) 100 MCG/2ML IJ SOLN
25.0000 ug | INTRAMUSCULAR | Status: DC | PRN
  Administered 2016-10-14 (×2): 25 ug via INTRAVENOUS

## 2016-10-14 MED ORDER — ROCURONIUM BROMIDE 100 MG/10ML IV SOLN
INTRAVENOUS | Status: DC | PRN
  Administered 2016-10-14: 30 mg via INTRAVENOUS

## 2016-10-14 MED ORDER — OXYCODONE HCL 5 MG/5ML PO SOLN: 5.0000 mg | Freq: Once | ORAL | Status: AC | PRN

## 2016-10-14 MED ORDER — LIDOCAINE HCL (CARDIAC) 20 MG/ML IV SOLN
INTRAVENOUS | Status: DC | PRN
  Administered 2016-10-14: 50 mg via INTRAVENOUS

## 2016-10-14 MED ORDER — SULFAMETHOXAZOLE-TRIMETHOPRIM 800-160 MG PO TABS: 1.0000 | ORAL_TABLET | Freq: Two times a day (BID) | ORAL | 0 refills | Status: DC

## 2016-10-14 MED ORDER — ONDANSETRON HCL 4 MG/2ML IJ SOLN
INTRAMUSCULAR | Status: DC | PRN
  Administered 2016-10-14: 4 mg via INTRAVENOUS

## 2016-10-14 MED ORDER — ACETAMINOPHEN 10 MG/ML IV SOLN
1000.0000 mg | Freq: Once | INTRAVENOUS | Status: AC
  Administered 2016-10-14: 1000 mg via INTRAVENOUS

## 2016-10-14 MED ORDER — GLYCOPYRROLATE 0.2 MG/ML IJ SOLN
INTRAMUSCULAR | Status: DC | PRN
  Administered 2016-10-14: 0.1 mg via INTRAVENOUS

## 2016-10-14 MED ORDER — LACTATED RINGERS IV SOLN
10.0000 mL/h | INTRAVENOUS | Status: DC
  Administered 2016-10-14: 10 mL/h via INTRAVENOUS

## 2016-10-14 MED ORDER — OXYCODONE HCL 5 MG PO TABS
5.0000 mg | ORAL_TABLET | Freq: Once | ORAL | Status: AC | PRN
  Administered 2016-10-14: 5 mg via ORAL

## 2016-10-14 MED ORDER — PROMETHAZINE HCL 25 MG/ML IJ SOLN: 6.2500 mg | INTRAMUSCULAR | Status: DC | PRN

## 2016-10-14 MED ORDER — DEXAMETHASONE SODIUM PHOSPHATE 4 MG/ML IJ SOLN
INTRAMUSCULAR | Status: DC | PRN
  Administered 2016-10-14: 12 mg via INTRAVENOUS

## 2016-10-14 MED ORDER — MEPERIDINE HCL 25 MG/ML IJ SOLN: 6.2500 mg | INTRAMUSCULAR | Status: DC | PRN

## 2016-10-14 MED ORDER — PHENYLEPHRINE HCL 0.5 % NA SOLN
NASAL | Status: DC | PRN
  Administered 2016-10-14: 15 mL via TOPICAL

## 2016-10-14 SURGICAL SUPPLY — 33 items
BALLN SINUPLASTY KIT 6X16 (BALLOONS) ×4
BALLOON SINUPLASTY KIT 6X16 (BALLOONS) ×3 IMPLANT
BATTERY INSTRU NAVIGATION (MISCELLANEOUS) ×12 IMPLANT
CANISTER SUCT 1200ML W/VALVE (MISCELLANEOUS) ×4 IMPLANT
COAG SUCT 10F 3.5MM HAND CTRL (MISCELLANEOUS) ×4 IMPLANT
CUP MEDICINE 2OZ PLAST GRAD ST (MISCELLANEOUS) ×4 IMPLANT
DEVICE INFLATION SEID (MISCELLANEOUS) ×4 IMPLANT
DRAPE HEAD BAR (DRAPES) ×4 IMPLANT
DRESSING NASL FOAM PST OP SINU (MISCELLANEOUS) IMPLANT
DRSG NASAL FOAM POST OP SINU (MISCELLANEOUS)
GLOVE BIO SURGEON STRL SZ7.5 (GLOVE) ×8 IMPLANT
HANDLE YANKAUER SUCT BULB TIP (MISCELLANEOUS) ×4 IMPLANT
KIT ROOM TURNOVER OR (KITS) ×4 IMPLANT
NEEDLE HYPO 25GX1X1/2 BEV (NEEDLE) ×4 IMPLANT
NS IRRIG 500ML POUR BTL (IV SOLUTION) ×4 IMPLANT
PACK DRAPE NASAL/ENT (PACKS) ×4 IMPLANT
PAD GROUND ADULT SPLIT (MISCELLANEOUS) ×4 IMPLANT
SOL ANTI-FOG 6CC FOG-OUT (MISCELLANEOUS) ×3 IMPLANT
SOL FOG-OUT ANTI-FOG 6CC (MISCELLANEOUS) ×1
SPONGE NEURO XRAY DETECT 1X3 (DISPOSABLE) ×4 IMPLANT
STRAP BODY AND KNEE 60X3 (MISCELLANEOUS) ×4 IMPLANT
SUT CHROMIC 3-0 (SUTURE)
SUT CHROMIC 3-0 KS 27XMFL CR (SUTURE)
SUT CHROMIC 5-0 (SUTURE)
SUT CHROMIC 5-0 P2 18XMFL CR (SUTURE)
SUT ETHILON 3-0 KS 30 BLK (SUTURE) IMPLANT
SUT PLAIN GUT 4-0 (SUTURE) ×4 IMPLANT
SUTURE CHRMC 3-0 KS 27XMFL CR (SUTURE) IMPLANT
SUTURE CHRMC 5-0 P2 18XMF CR (SUTURE) IMPLANT
SYRINGE 10CC LL (SYRINGE) ×4 IMPLANT
TOWEL OR 17X26 4PK STRL BLUE (TOWEL DISPOSABLE) ×4 IMPLANT
TRACKER CRANIALMASK (MASK) ×4 IMPLANT
WATER STERILE IRR 250ML POUR (IV SOLUTION) ×4 IMPLANT

## 2016-10-14 NOTE — Op Note (Signed)
10/14/2016  12:23 PM    Marcus Zavala  [id number]   Pre-Op Dx: deviated septum turbinate hypertrophy chronic sinusitis mariliary ethmoid frontal  Post-op Dx: SAME  Proc: #1 use of Stryker navigation system #2 right frontal endoscopic balloon dilatation and irrigation #3 bilateral endoscopic maxillary antrostomy with removal of tissue #4 bilateral endoscopic anterior posterior ethmoidectomy with removal of tissue #5 revision septoplasty #6 bilateral submucous resection of the inferior turbinates   Surg:  Marcus Zavala  Anes:  GOT  EBL:  Less than 20 cc  Comp:  None  Findings:  Thickened mucosa in the maxillary ethmoid sinuses right inferior septal spur bilateral inferior turbinate hypertrophy  Procedure: Marcus Zavala was identified in the holding area taken the operating room placed in supine position. After general endotracheal intubation the table was turned 90 the Stryker navigation device was applied and calibrated and remained on throughout the procedure a topical anesthetic of phenylephrine lidocaine was placed on cottonoid pledgets within each nostril. After 5 minutes these were removed. A local anesthetic 1% lidocaine with 1 100,000 units of epinephrine was used to inject the nasal septum the lateral nasal wall and inferior turbinates. A total of 12 cc was used. The patient was then draped usual fashion for endoscopic sinus surgery. The endoscopic equipment was then brought into the field the operation first proceeded with the frontal sinus trephination and balloon dilatation. The balloon device was introduced into the right nostril was placed been behind the uncinate process and anterior to the ethmoid bulla the wire was then advanced up through the nasofrontal duct into the frontal sinus. The balloon wire light could easily be seen within the frontal sinus via transillumination. The balloon was advanced outward into appropriate position. 3 dilatations were done up to 12 atm while  advancing the balloon outward at 3 different stations. Saline was then irrigated through the catheter there was excellent flow return of the saline. The wire was then removed and the balloon device was removed as well. The operation then turned to the maxillary antrostomies beginning on the right-hand side and the uncinate process was taken down using the Cottle elevator. Curved suction was then placed in the maxillary sinus the remainder the uncinate process was taken down using the straight and side biting forceps. There was thickened mucosa which was removed. In similar fashion the left side was examined the uncinate process was taken down with straight and side biting forceps were used to open the antrostomy widely again thickened mucosa was removed on the left. The right side was then readdressed the ethmoid bulla was then opened the anterior posterior ethmoid cells were opened and cleared using straight and 45 forceps. There was polypoid mucosa which was removed. In similar fashion the left anterior posterior ethmoids were opened and cleaned using straight and side biting forceps. With sinus portion completed the operation then turned to revision septoplasty. It was operation palpation of the septum that a significant amount of cartilage and bone a previously been removed the main obstructing portion was a spur on the right inferior aspect. An incision was made over this spur and the underlying cartilage was trimmed down and excised thus removing the spur. I gave excellent reduction in the curvature. A through and through whipstitch was placed through the septum to reapproximate these mucosal edges. A plane of 3-0 chromic was used. The operation then turned the submucous resection inferior turbinates beginning on the left-hand side a 15 blade was used to incise along the inferior edge the inferior  turbinate down the chonchal bone.. Laterally based flap was elevated in the chonchal bone and underlying mucosa  were excised using the [employer]. The superior laterally based flap was then laid back down over the stump and the suction cautery was used to cauterize beneath the inferior turbinate for hemostasis. In similar fashion a submucous resection was performed on the right. With no active bleeding Stammberger gel was placed beneath each inferior turbinates and into the ethmoid cavities bilaterally. The patient was in return anesthesia where he was awakened in the operating room taken recovery room stable condition.  Cultures: None  Specimens: Sinus contents  Estimated blood loss less than 20 cc  Dispo:   Good  Plan:  Discharge to home follow-up 1 week  Marcus Zavala  10/14/2016 12:23 PM

## 2016-10-14 NOTE — Anesthesia Postprocedure Evaluation (Signed)
Anesthesia Post Note  Patient: Marcus Zavala  Procedure(s) Performed: Procedure(s) (LRB): IMAGE GUIDED SINUS SURGERY (N/A) SEPTOPLASTY revision (N/A) SUBMUCOSAL RESECTION (Bilateral) MAXILLARY ANTROSTOMY with tissue removal (Bilateral) ETHMOIDECTOMY total with tissue removal (Bilateral) frontal SINUSOTOMY with possible balloon (Right)  Patient location during evaluation: PACU Anesthesia Type: General Level of consciousness: awake and alert Pain management: pain level controlled Vital Signs Assessment: post-procedure vital signs reviewed and stable Respiratory status: spontaneous breathing, nonlabored ventilation, respiratory function stable and patient connected to nasal cannula oxygen Cardiovascular status: blood pressure returned to baseline and stable Postop Assessment: no signs of nausea or vomiting Anesthetic complications: no    SCOURAS, NICOLE ELAINE

## 2016-10-14 NOTE — Transfer of Care (Signed)
Immediate Anesthesia Transfer of Care Note  Patient: Marcus Zavala  Procedure(s) Performed: Procedure(s) with comments: IMAGE GUIDED SINUS SURGERY (N/A) - sleep apnea-CPAP NEED DISK NEED BALLOON REP PRESENT STRYKER GAVE DISK TO CECE 4-5 KP SEPTOPLASTY revision (N/A) SUBMUCOSAL RESECTION (Bilateral) MAXILLARY ANTROSTOMY with tissue removal (Bilateral) ETHMOIDECTOMY total with tissue removal (Bilateral) frontal SINUSOTOMY with possible balloon (Right)  Patient Location: PACU  Anesthesia Type: General  Level of Consciousness: awake, alert  and patient cooperative  Airway and Oxygen Therapy: Patient Spontanous Breathing and Patient connected to supplemental oxygen  Post-op Assessment: Post-op Vital signs reviewed, Patient's Cardiovascular Status Stable, Respiratory Function Stable, Patent Airway and No signs of Nausea or vomiting  Post-op Vital Signs: Reviewed and stable  Complications: No apparent anesthesia complications

## 2016-10-14 NOTE — H&P (Signed)
The patient's history has been reviewed, patient examined, no change in status, stable for surgery.  Questions were answered to the patients satisfaction.  

## 2016-10-14 NOTE — Anesthesia Procedure Notes (Signed)
Procedure Name: Intubation Date/Time: 10/14/2016 11:24 AM Performed by: Andee PolesBUSH, Ahlani Wickes Pre-anesthesia Checklist: Patient identified, Emergency Drugs available, Suction available, Patient being monitored and Timeout performed Patient Re-evaluated:Patient Re-evaluated prior to induction Oxygen Delivery Method: Circle system utilized Preoxygenation: Pre-oxygenation with 100% oxygen Induction Type: IV induction Ventilation: Mask ventilation without difficulty Laryngoscope Size: Glidescope Grade View: Grade I Tube type: Oral Rae Tube size: 7.5 mm Number of attempts: 1 Placement Confirmation: ETT inserted through vocal cords under direct vision,  positive ETCO2 and breath sounds checked- equal and bilateral Tube secured with: Tape Dental Injury: Teeth and Oropharynx as per pre-operative assessment  Difficulty Due To: Difficulty was anticipated and Difficult Airway- due to large tongue

## 2016-10-17 ENCOUNTER — Encounter: Payer: Self-pay | Admitting: Unknown Physician Specialty

## 2016-10-18 LAB — SURGICAL PATHOLOGY

## 2016-10-31 IMAGING — CT CT CTA ABD/PEL W/CM AND/OR W/O CM
1 of 5 series · 9 of 32 positions shown, 15 images · IV contrast (omnipaque)
Comparison: Report of findings from CTA of the chest performed at
[place] in April 2013.

CLINICAL DATA: Acute onset of generalized chest pain. Assess for
aortic dissection. Initial encounter.

EXAM:
CT ANGIOGRAPHY CHEST, ABDOMEN AND PELVIS
TECHNIQUE: Multidetector CT imaging through the chest, abdomen and pelvis was
performed using the standard protocol during bolus administration of
intravenous contrast. Multiplanar reconstructed images and MIPs were
obtained and reviewed to evaluate the vascular anatomy.
CONTRAST:  100mL OMNIPAQUE IOHEXOL 350 MG/ML SOLN

[Series 6: cta chest · axial · 0.85mm/px · z∈[-388,-132]mm · 9 of 158 slices shown, 15 images]
[im 15/158  soft-tissue]
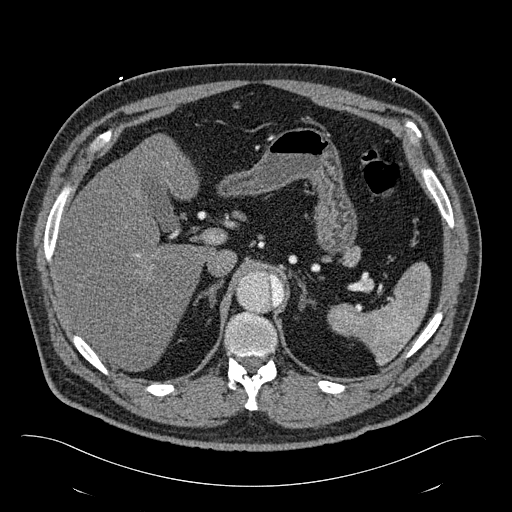
[im 15/158  bone]
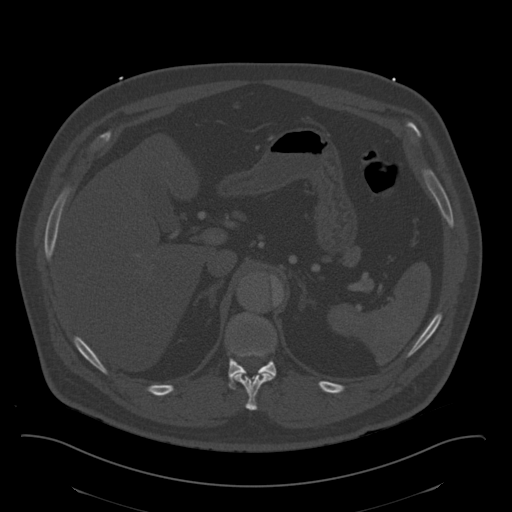
[im 29/158  soft-tissue]
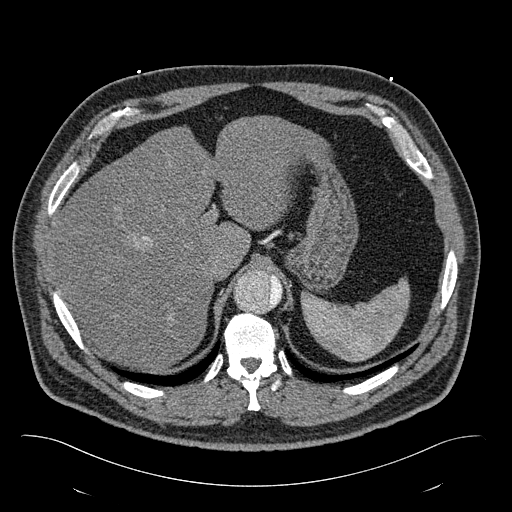
[im 43/158  soft-tissue]
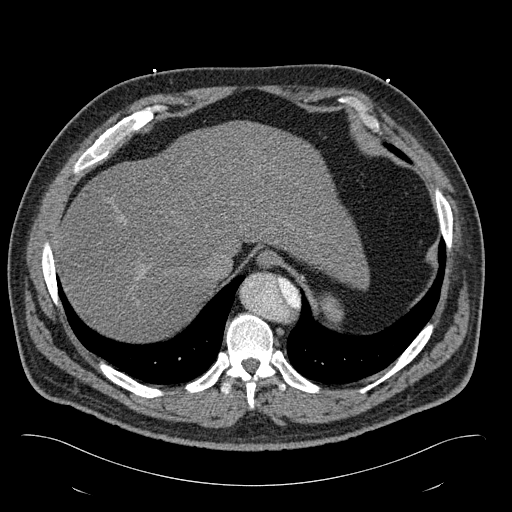
[im 58/158  soft-tissue]
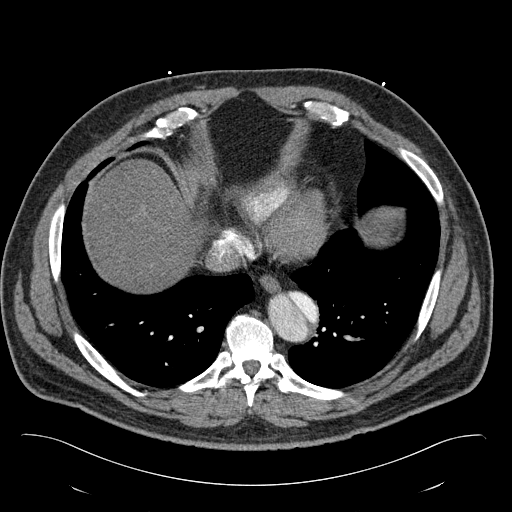
[im 86/158  soft-tissue]
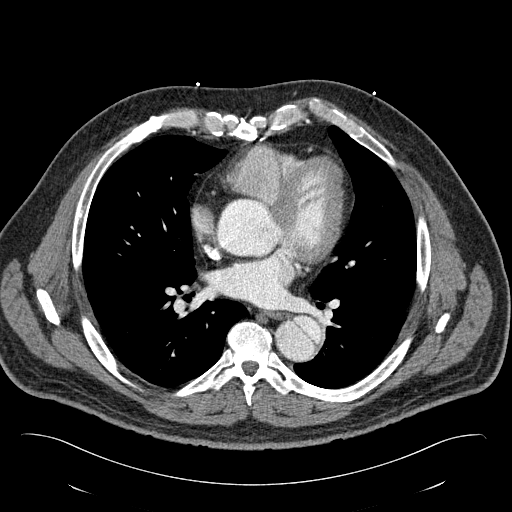
[im 100/158  soft-tissue]
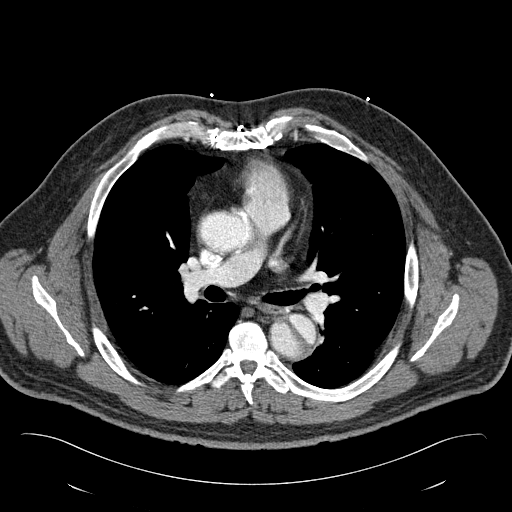
[im 100/158  lung]
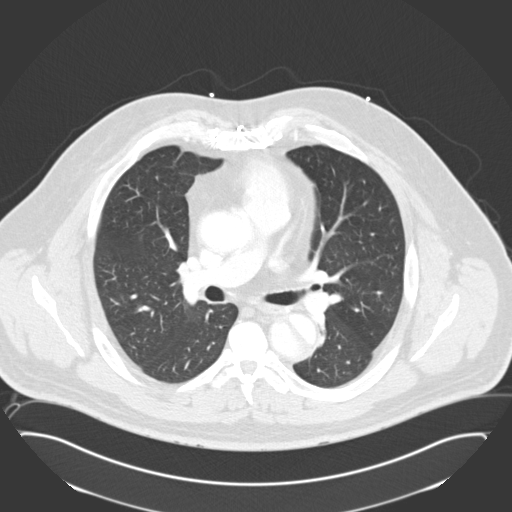
[im 115/158  soft-tissue]
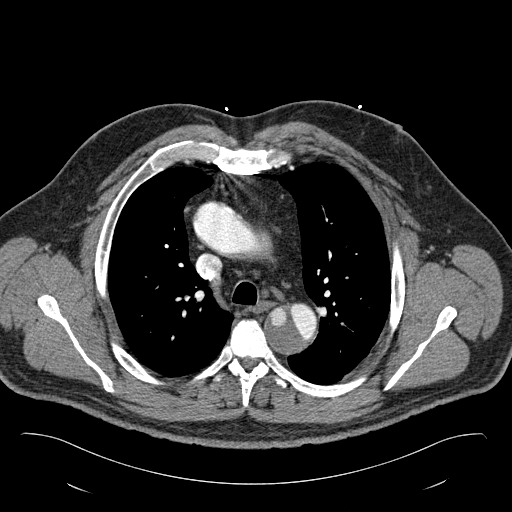
[im 115/158  lung]
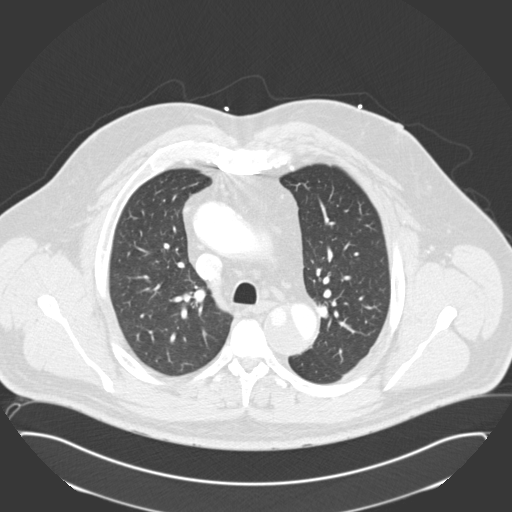
[im 129/158  soft-tissue]
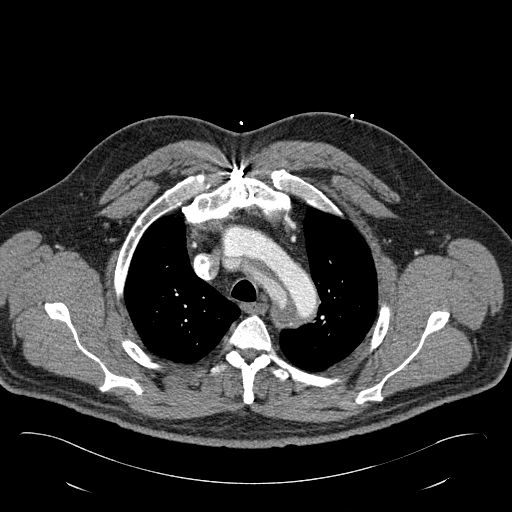
[im 129/158  lung]
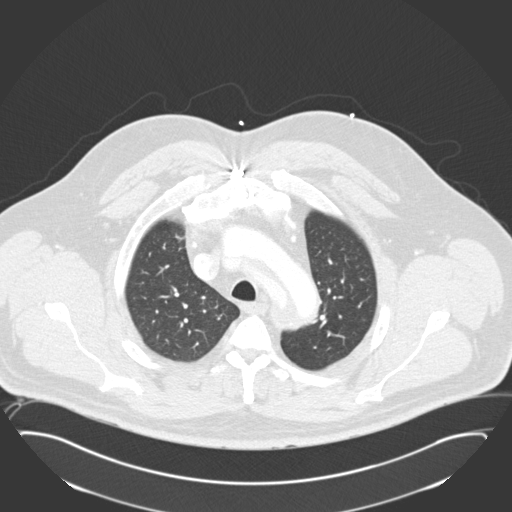
[im 143/158  soft-tissue]
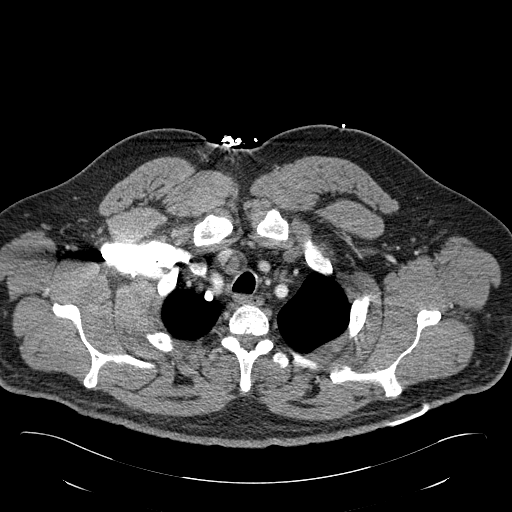
[im 143/158  lung]
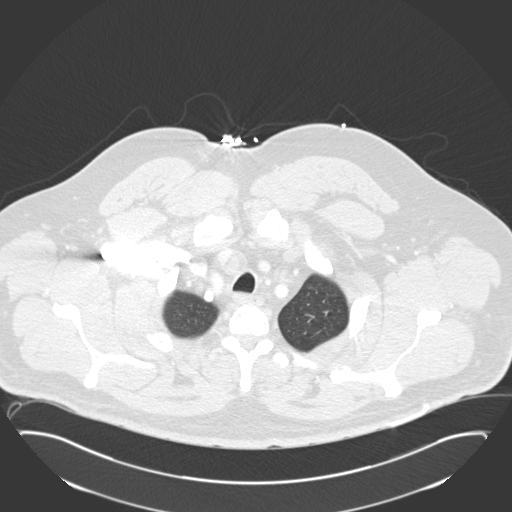
[im 143/158  bone]
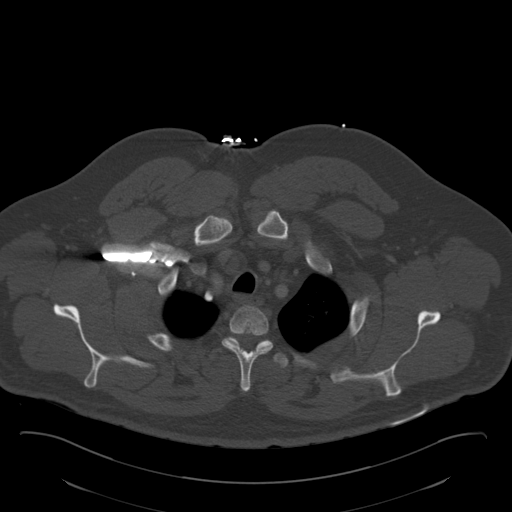

[9 of 32 positions shown; findings below may reference images not displayed]

FINDINGS: CTA CHEST FINDINGS

Per correlation with prior report, the aortic dissection has not
changed significantly in distribution, status post repair along the
ascending thoracic aorta. The residual dissection flap is seen along
the right innominate artery, with the false lumen noted along the
posterior aspect of the aortic arch and right side of the descending
thoracic aorta. It extends inferiorly along the left common iliac
artery and into the left internal and external iliac arteries.

There is likely increased thrombosis of the false lumen at the
proximal descending thoracic aorta. Known fenestrations are
difficult to fully characterize due to the phase of contrast
enhancement. There is mildly delayed enhancement of the false lumen
along the distal thoracic and abdominal aorta, though the right
kidney, which is supplied by the false lumen, still enhances
normally.

The aortic root appears to be mildly more dilated than on the prior
study, now measuring 5.0 cm in AP dimension. The aortic arch now
measures 4.2 cm in maximal diameter, while the descending thoracic
aorta measures approximately 4.2 x 3.9 cm. These measurements are
increased from the prior CT.

Though the dissection flap extends into the right innominate artery,
this appears to resolve at the origin of the right common carotid
artery, without evidence of extension into the distal right
subclavian or right common carotid arteries. The remaining great
vessels are grossly unremarkable, arising from the true lumen.
Incidental note is made of a direct origin of the left vertebral
artery from the aortic arch.

There is no evidence of pulmonary embolus.

The lungs are clear bilaterally. There is no evidence of significant
focal consolidation, pleural effusion or pneumothorax. No masses are
identified; no abnormal focal contrast enhancement is seen.

The patient is status post median sternotomy. The mediastinum is
otherwise unremarkable. No mediastinal lymphadenopathy is seen. No
pericardial effusion is identified. No axillary lymphadenopathy is
seen. The visualized portions of the thyroid gland are unremarkable
in appearance.

No acute osseous abnormalities are seen.

Review of the MIP images confirms the above findings.

CTA ABDOMEN AND PELVIS FINDINGS

As described above, there is extension of the dissection along the
abdominal aorta and into the left common iliac artery. The celiac
trunk, superior mesenteric artery and inferior mesenteric arteries
arise from the true lumen. The left renal artery also arises from
the true lumen, while the right renal artery arises from the false
lumen. The right common iliac artery arises from the true lumen.

Evaluation of the left external and internal iliac arteries is
somewhat suboptimal due to limitations in contrast enhancement, but
the dissection flap appears to extend into both the left external
and internal iliac arteries. There is mildly delayed enhancement of
the false lumen. The known fenestration at the level of the renal
arteries is not well characterized.

The abdominal aorta measures up to 4.4 cm in AP dimension and 4.3 cm
in transverse dimension, with aneurysmal dilatation of the left
common iliac artery to 2.6 cm.

The liver and spleen are unremarkable in appearance. The gallbladder
is within normal limits. The pancreas and adrenal glands are
unremarkable.

A 2.4 cm right renal cyst is noted. Mild nonspecific perinephric
stranding is noted bilaterally. There is no evidence of
hydronephrosis. No renal or ureteral stones are seen

No free fluid is identified. The small bowel is unremarkable in
appearance. The stomach is within normal limits. No acute vascular
abnormalities are seen.

The appendix is normal in caliber, without evidence of appendicitis.
The colon is unremarkable in appearance.

The bladder is mildly distended and grossly unremarkable. The
prostate is not fully assessed on this study. No inguinal
lymphadenopathy is seen.

No acute osseous abnormalities are identified. Endplate sclerotic
change is noted at L5-S1.

Review of the MIP images confirms the above findings.
IMPRESSION: 1. Relatively stable appearance to aortic dissection, status post
repair along the ascending thoracic aorta. Residual dissection flap
extends along the right innominate artery, with the false lumen
noted at the posterior aspect of the aortic arch and right side of
the descending thoracic and abdominal aorta. Likely increased
thrombosis of the false lumen at the proximal descending thoracic
aorta. Known fenestrations are difficult to fully characterize due
to the phase of contrast enhancement. Mildly delayed enhancement of
the false lumen along the distal thoracic and abdominal aorta,
though the right kidney, which is supplied by the false lumen, still
enhances normally.
2. Apparent increased dilatation of the aortic root, aortic arch,
descending thoracic aorta and abdominal aorta since the prior study,
measuring 5.0 cm at the aortic root, 4.2 cm at the aortic arch,
x 3.9 cm at the descending thoracic aorta, and 4.4 x 4.3 cm at the
abdominal aorta. Aneurysmal dilatation of the left common iliac
artery to 2.6 cm.
3. Though the dissection flap extends into the right innominate
artery, this appears to resolve at the origin of the right common
carotid artery, without evidence of extension into the distal right
subclavian or right common carotid arteries.
4. Dissection flap appears to extend into the left external and
internal iliac arteries, though this is not well assessed due to the
phase of contrast enhancement.
5. No evidence of pulmonary embolus.
6. Lungs clear bilaterally.
7. Small right renal cyst seen.
These results were called by telephone at the time of interpretation
on 12/13/2014 at [DATE] to Dr. RYOUTOKU RAJAPAKSHE, who verbally
acknowledged these results.

## 2016-11-17 ENCOUNTER — Encounter: Payer: Self-pay | Admitting: Family Medicine

## 2016-11-17 ENCOUNTER — Ambulatory Visit (INDEPENDENT_AMBULATORY_CARE_PROVIDER_SITE_OTHER): Payer: 59 | Admitting: Family Medicine

## 2016-11-17 VITALS — BP 138/79 | HR 83 | Temp 98.2°F | Resp 16 | Ht 75.0 in | Wt 274.3 lb

## 2016-11-17 DIAGNOSIS — R141 Gas pain: Secondary | ICD-10-CM | POA: Diagnosis not present

## 2016-11-17 DIAGNOSIS — E119 Type 2 diabetes mellitus without complications: Secondary | ICD-10-CM | POA: Diagnosis not present

## 2016-11-17 LAB — POCT GLYCOSYLATED HEMOGLOBIN (HGB A1C): HEMOGLOBIN A1C: 6.8

## 2016-11-17 MED ORDER — SIMETHICONE 125 MG PO CHEW: 125.0000 mg | CHEWABLE_TABLET | Freq: Two times a day (BID) | ORAL | 0 refills | Status: DC

## 2016-11-17 NOTE — Progress Notes (Signed)
Name: Marcus Zavala   MRN: [id number]    DOB: [birth year]   Date:11/17/2016       Progress Note  Subjective  Chief Complaint  Chief Complaint  Patient presents with  . Hospitalization Follow-up    Stomach issues    Abdominal Pain  This is a new problem. The current episode started in the past 7 days. The pain is located in the generalized abdominal region (present in the left and right upper to mid abdomen ). The pain is at a severity of 1/10. The quality of the pain is sharp. The abdominal pain does not radiate. Associated symptoms include flatus. Pertinent negatives include no anorexia, belching, constipation, diarrhea, hematochezia, hematuria, nausea or vomiting. The pain is aggravated by movement. The pain is relieved by being still and recumbency. He has tried antacids (received Pepcid which helped relieve the abdominal pain) for the symptoms. Prior diagnostic workup includes CT scan.      Past Medical History:  Diagnosis Date  . Allergy   . Aortic dissection (HCC)   . Hypertension   . Sleep apnea    CPAP    Past Surgical History:  Procedure Laterality Date  . AORTA SURGERY  03/2009  . ETHMOIDECTOMY Bilateral 10/14/2016   Procedure: ETHMOIDECTOMY total with tissue removal;  Surgeon: Linus SalmonsMcQueen, Chapman, MD;  [facility];  Service: ENT;  Laterality: Bilateral;  . IMAGE GUIDED SINUS SURGERY N/A 10/14/2016   Procedure: IMAGE GUIDED SINUS SURGERY;  Surgeon: Linus SalmonsMcQueen, Chapman, MD;  [facility];  Service: ENT;  Laterality: N/A;  sleep apnea-CPAP NEED DISK NEED BALLOON REP PRESENT STRYKER GAVE DISK TO CECE 4-5 KP  . MAXILLARY ANTROSTOMY Bilateral 10/14/2016   Procedure: MAXILLARY ANTROSTOMY with tissue removal;  Surgeon: Linus SalmonsMcQueen, Chapman, MD;  [facility];  Service: ENT;  Laterality: Bilateral;  . NASAL TURBINATE REDUCTION Bilateral 10/14/2016   Procedure: SUBMUCOSAL RESECTION;  Surgeon: Linus SalmonsMcQueen, Chapman, MD;  [facility];  Service: ENT;  Laterality: Bilateral;  . SEPTOPLASTY N/A 10/14/2016   Procedure: SEPTOPLASTY revision;  Surgeon: Linus SalmonsMcQueen, Chapman, MD;  [facility];  Service: ENT;  Laterality: N/A;  . SINUSOTOMY Right 10/14/2016   Procedure: frontal SINUSOTOMY with possible balloon;  Surgeon: Linus SalmonsMcQueen, Chapman, MD;  [facility];  Service: ENT;  Laterality: Right;    Family History  Problem Relation Age of Onset  . Diabetes Mother   . Heart disease Father   . Cancer Father        Lymphatic  . Heart disease Brother   . Diabetes Brother     Social History   Social History  . Marital status: Married    Spouse name: N/A  . Number of children: N/A  . Years of education: N/A   Occupational History  . Not on file.   Social History Main Topics  . Smoking status: Never Smoker  . Smokeless tobacco: Current User  . Alcohol use No  . Drug use: No  . Sexual activity: Yes    Partners: Female   Other Topics Concern  . Not on file   Social History Narrative  . No narrative on file     Current Outpatient Prescriptions:  .  amLODipine (NORVASC) 10 MG tablet, Take 1 tablet by mouth at bedtime. , Disp: , Rfl: 11 .  famotidine (PEPCID) 20 MG tablet, Take 20 mg by mouth., Disp: , Rfl:  .  losartan-hydrochlorothiazide (HYZAAR) 100-25 MG per tablet, Take 1 tablet by mouth at  bedtime., Disp: , Rfl:  .  Multiple Vitamin (MULTIVITAMIN) capsule, Take 1 capsule by mouth daily. Reported on 07/14/2015, Disp: , Rfl:  .  NEEDLE, DISP, 22 G 22G X 1-1/2" MISC, Reported on 07/14/2015, Disp: , Rfl:  .  testosterone cypionate (DEPOTESTOSTERONE CYPIONATE) 200 MG/ML injection, Reported on 07/14/2015, Disp: , Rfl: 3  Allergies  Allergen Reactions  . Penicillins Hives  . Ciprofloxacin Nausea Only  . Lisinopril Swelling    Hand swelling     Review of Systems  Gastrointestinal: Positive for abdominal pain and flatus. Negative for anorexia, constipation, diarrhea,  hematochezia, nausea and vomiting.  Genitourinary: Negative for hematuria.      Objective  Vitals:   11/17/16 0929  BP: 138/79  Pulse: 83  Resp: 16  Temp: 98.2 F (36.8 C)  TempSrc: Oral  SpO2: 97%  Weight: 274 lb 4.8 oz (124.4 kg)  Height:  (1.905 m)    Physical Exam  Constitutional: He is well-developed, well-nourished, and in no distress.  Cardiovascular: Normal rate, regular rhythm and normal heart sounds.   No murmur heard. Pulmonary/Chest: Effort normal and breath sounds normal. He has no wheezes.  Abdominal: Soft. Bowel sounds are normal. There is tenderness. There is no rebound.    Tenderness to palpation over the left and right mid-central abdomen, no rebound, no guarding, normal bowel sounds.    Nursing note and vitals reviewed.     Assessment & Plan  1. Abdominal gas pain Reviewed notes from [facility] ER visit, CT scan of abdomen was unremarkable except for the aneurysm. Suspect patient has gas pains, this is evidenced by the fact that Pepcid makes his symptoms better. Advised to take Gas-X for next 3-5 days. May continue to take Pepcid - simethicone (GAS-X EXTRA STRENGTH) 125 MG chewable tablet; Chew 1 tablet (125 mg total) by mouth 2 (two) times daily after a meal.  Dispense: 10 tablet; Refill: 0  2. Controlled type 2 diabetes mellitus without complication, without long-term current use of insulin (HCC)   A1c 6.8%, uncontrolled diabetes, educated on dietary and lifestyle changes including avoidance of carbohydrates, increasing physical activity etc. Recheck in 3 months. We elected not to start any pharmacotherapy at today's visit - POCT glycosylated hemoglobin (Hb A1C)   Wendel Homeyer Asad A. Faylene Kurtz [facility] 11/17/2016 9:51 AM

## 2016-11-29 ENCOUNTER — Ambulatory Visit: Payer: 59 | Admitting: Family Medicine

## 2016-11-30 DIAGNOSIS — I714 Abdominal aortic aneurysm, without rupture, unspecified: Secondary | ICD-10-CM

## 2016-11-30 HISTORY — DX: Abdominal aortic aneurysm, without rupture, unspecified: I71.40

## 2016-11-30 HISTORY — DX: Abdominal aortic aneurysm, without rupture: I71.4

## 2017-01-30 ENCOUNTER — Ambulatory Visit (INDEPENDENT_AMBULATORY_CARE_PROVIDER_SITE_OTHER): Payer: Self-pay | Admitting: Family Medicine

## 2017-01-30 ENCOUNTER — Encounter: Payer: Self-pay | Admitting: Family Medicine

## 2017-01-30 VITALS — BP 138/74 | HR 85 | Temp 98.2°F | Resp 14 | Ht 75.0 in | Wt 281.8 lb

## 2017-01-30 DIAGNOSIS — Z008 Encounter for other general examination: Secondary | ICD-10-CM

## 2017-01-30 DIAGNOSIS — R141 Gas pain: Secondary | ICD-10-CM

## 2017-01-30 DIAGNOSIS — Z0189 Encounter for other specified special examinations: Secondary | ICD-10-CM

## 2017-01-30 NOTE — Progress Notes (Signed)
Name: Marcus Zavala   MRN: [id number]    DOB: 1970-12-19   Date:01/30/2017       Progress Note  Subjective  Chief Complaint  Chief Complaint  Patient presents with  . Annual Exam    with fasting labs    HPI  Pt. Presents for completion of biometric screening form He is not interested in a full physical exam at this time.   Patient still has intermittent lower abdominal pain, worse with standing, improved with supine or sitting down, is evaluated 2 months ago and was diagnosed with abdominal gas pains, was started on Gas-X, [facility]. [facility] was reviewed in detail, offers no evidence for any etiology of lower abdominal pain  Past Medical History:  Diagnosis Date  . Allergy   . Aortic dissection (HCC)   . Hypertension   . Sleep apnea    CPAP    Past Surgical History:  Procedure Laterality Date  . AORTA SURGERY  03/2009  . ETHMOIDECTOMY Bilateral 10/14/2016   Procedure: ETHMOIDECTOMY total with tissue removal;  Surgeon: Linus SalmonsMcQueen, Chapman, MD;  [facility];  Service: ENT;  Laterality: Bilateral;  . IMAGE GUIDED SINUS SURGERY N/A 10/14/2016   Procedure: IMAGE GUIDED SINUS SURGERY;  Surgeon: Linus SalmonsMcQueen, Chapman, MD;  [facility];  Service: ENT;  Laterality: N/A;  sleep apnea-CPAP NEED DISK NEED BALLOON REP PRESENT STRYKER GAVE DISK TO CECE 4-5 KP  . MAXILLARY ANTROSTOMY Bilateral 10/14/2016   Procedure: MAXILLARY ANTROSTOMY with tissue removal;  Surgeon: Linus SalmonsMcQueen, Chapman, MD;  [facility];  Service: ENT;  Laterality: Bilateral;  . NASAL TURBINATE REDUCTION Bilateral 10/14/2016   Procedure: SUBMUCOSAL RESECTION;  Surgeon: Linus SalmonsMcQueen, Chapman, MD;  [facility];  Service: ENT;  Laterality: Bilateral;  . SEPTOPLASTY N/A 10/14/2016   Procedure: SEPTOPLASTY revision;  Surgeon: Linus SalmonsMcQueen, Chapman, MD;  [facility];  Service: ENT;   Laterality: N/A;  . SINUSOTOMY Right 10/14/2016   Procedure: frontal SINUSOTOMY with possible balloon;  Surgeon: Linus SalmonsMcQueen, Chapman, MD;  [facility];  Service: ENT;  Laterality: Right;    Family History  Problem Relation Age of Onset  . Diabetes Mother   . Heart disease Father   . Cancer Father        Lymphatic  . Heart disease Brother   . Diabetes Brother     Social History   Socioeconomic History  . Marital status: Married    Spouse name: Not on file  . Number of children: Not on file  . Years of education: Not on file  . Highest education level: Not on file  Social Needs  . Financial resource strain: Not on file  . Food insecurity - worry: Not on file  . Food insecurity - inability: Not on file  . Transportation needs - medical: Not on file  . Transportation needs - non-medical: Not on file  Occupational History  . Not on file  Tobacco Use  . Smoking status: Never Smoker  . Smokeless tobacco: Current User  Substance and Sexual Activity  . Alcohol use: No    Alcohol/week: 0.0 oz  . Drug use: No  . Sexual activity: Yes    Partners: Female  Other Topics Concern  . Not on file  Social History Narrative  . Not on file     Current Outpatient Medications:  .  amLODipine (NORVASC) 10 MG tablet, Take 1 tablet by mouth at bedtime. ,  Disp: , Rfl: 11 .  cetirizine (ZYRTEC) 10 MG tablet, Take 10 mg by mouth at bedtime., Disp: , Rfl:  .  losartan-hydrochlorothiazide (HYZAAR) 100-25 MG per tablet, Take 1 tablet by mouth at bedtime., Disp: , Rfl:  .  Multiple Vitamin (MULTIVITAMIN) capsule, Take 1 capsule by mouth daily. Reported on 07/14/2015, Disp: , Rfl:  .  simethicone (GAS-X EXTRA STRENGTH) 125 MG chewable tablet, Chew 1 tablet (125 mg total) by mouth 2 (two) times daily after a meal., Disp: 10 tablet, Rfl: 0 .  famotidine (PEPCID) 20 MG tablet, Take 20 mg by mouth., Disp: , Rfl:  .  NEEDLE, DISP, 22 G 22G X 1-1/2" MISC, Reported on 07/14/2015, Disp: , Rfl:   .  testosterone cypionate (DEPOTESTOSTERONE CYPIONATE) 200 MG/ML injection, Reported on 07/14/2015, Disp: , Rfl: 3  Allergies  Allergen Reactions  . Penicillins Hives  . Ciprofloxacin Nausea Only  . Lisinopril Swelling    Hand swelling     ROS  Please see history of present illness for complete discussion of ROS  Objective  Vitals:   01/30/17 1452 01/30/17 1456  BP: (!) 144/62 138/74  Pulse: 85   Resp: 14   Temp: 98.2 F (36.8 C)   TempSrc: Oral   SpO2: 98%   Weight: 281 lb 12.8 oz (127.8 kg)   Height: 6\' 3"  (1.905 m)     Physical Exam  Constitutional: He is oriented to person, place, and time and well-developed, well-nourished, and in no distress.  HENT:  Head: Normocephalic and atraumatic.  Cardiovascular: Normal rate, regular rhythm and normal heart sounds.  No murmur heard. Pulmonary/Chest: Effort normal and breath sounds normal. He has no wheezes.  Abdominal: Soft. Bowel sounds are normal. There is no tenderness.  Musculoskeletal: He exhibits no edema.  Neurological: He is alert and oriented to person, place, and time.  Psychiatric: Mood, memory, affect and judgment normal.  Nursing note and vitals reviewed.    Recent Results (from the past 2160 hour(s))  POCT glycosylated hemoglobin (Hb A1C)     Status: Abnormal   Collection Time: 11/17/16 10:19 AM  Result Value Ref Range   Hemoglobin A1C 6.8      Assessment & Plan  1. Encounter for biometric screening Ordered screening lab work for biometrics. - Lipid panel - BASIC METABOLIC PANEL WITH GFR  2. Abdominal gas pain Once again, I suspect this is related to gas versus abdominal wall pain, advised to continue on Gas-X, relax the lower abdominal wall muscles, reassess if no improvement   Massey Ruhland Asad A. Faylene KurtzShah [facility] 01/30/2017 3:01 PM

## 2017-05-05 ENCOUNTER — Telehealth: Payer: Self-pay | Admitting: Family Medicine

## 2017-05-05 NOTE — Telephone Encounter (Signed)
Please call patient to schedule follow up with new PCP Dr. Sherie DonLada. Thank you!

## 2017-05-05 NOTE — Telephone Encounter (Signed)
Spoke with patient and appt was made

## 2017-05-11 DIAGNOSIS — K4090 Unilateral inguinal hernia, without obstruction or gangrene, not specified as recurrent: Secondary | ICD-10-CM | POA: Insufficient documentation

## 2017-05-25 ENCOUNTER — Ambulatory Visit: Payer: Self-pay | Admitting: Family Medicine

## 2017-05-25 DIAGNOSIS — E291 Testicular hypofunction: Secondary | ICD-10-CM | POA: Insufficient documentation

## 2017-05-25 DIAGNOSIS — H9209 Otalgia, unspecified ear: Secondary | ICD-10-CM | POA: Insufficient documentation

## 2017-05-25 DIAGNOSIS — R944 Abnormal results of kidney function studies: Secondary | ICD-10-CM | POA: Insufficient documentation

## 2017-05-25 DIAGNOSIS — H109 Unspecified conjunctivitis: Secondary | ICD-10-CM | POA: Insufficient documentation

## 2017-08-09 ENCOUNTER — Emergency Department
Admission: EM | Admit: 2017-08-09 | Discharge: 2017-08-09 | Disposition: A | Discharge status: Home / Self Care | Payer: 59 | Attending: Emergency Medicine | Admitting: Emergency Medicine

## 2017-08-09 ENCOUNTER — Encounter: Payer: Self-pay | Admitting: Emergency Medicine

## 2017-08-09 DIAGNOSIS — I714 Abdominal aortic aneurysm, without rupture: Secondary | ICD-10-CM | POA: Insufficient documentation

## 2017-08-09 DIAGNOSIS — K4090 Unilateral inguinal hernia, without obstruction or gangrene, not specified as recurrent: Secondary | ICD-10-CM | POA: Diagnosis present

## 2017-08-09 DIAGNOSIS — E119 Type 2 diabetes mellitus without complications: Secondary | ICD-10-CM | POA: Diagnosis not present

## 2017-08-09 DIAGNOSIS — I10 Essential (primary) hypertension: Secondary | ICD-10-CM | POA: Insufficient documentation

## 2017-08-09 LAB — COMPREHENSIVE METABOLIC PANEL
ALBUMIN: 4.2 g/dL (ref 3.5–5.0)
ALT: 29 U/L (ref 17–63)
ANION GAP: 9 (ref 5–15)
AST: 25 U/L (ref 15–41)
Alkaline Phosphatase: 42 U/L (ref 38–126)
BUN: 15 mg/dL (ref 6–20)
CHLORIDE: 101 mmol/L (ref 101–111)
CO2: 28 mmol/L (ref 22–32)
Calcium: 9.2 mg/dL (ref 8.9–10.3)
Creatinine, Ser: 1.11 mg/dL (ref 0.61–1.24)
GFR calc Af Amer: >60 mL/min (ref >60)
GFR calc non Af Amer: >60 mL/min (ref >60)
GLUCOSE: 165 mg/dL — AB (ref 65–99)
POTASSIUM: 3.8 mmol/L (ref 3.5–5.1)
SODIUM: 138 mmol/L (ref 135–145)
Total Bilirubin: 0.7 mg/dL (ref 0.3–1.2)
Total Protein: 7.5 g/dL (ref 6.5–8.1)

## 2017-08-09 LAB — CBC
HCT: 42.4 % (ref 40.0–52.0)
HEMOGLOBIN: 14.6 g/dL (ref 13.0–18.0)
MCH: 29.7 pg (ref 26.0–34.0)
MCHC: 34.5 g/dL (ref 32.0–36.0)
MCV: 86 fL (ref 80.0–100.0)
PLATELETS: 248 10*3/uL (ref 150–440)
RBC: 4.93 MIL/uL (ref 4.40–5.90)
RDW: 13.4 % (ref 11.5–14.5)
WBC: 8.3 10*3/uL (ref 3.8–10.6)

## 2017-08-09 MED ORDER — OXYCODONE-ACETAMINOPHEN 5-325 MG PO TABS: 1.0000 | ORAL_TABLET | Freq: Three times a day (TID) | ORAL | 0 refills | Status: DC | PRN

## 2017-08-09 NOTE — ED Notes (Signed)
First Nurse Note:  Patient complaining of abdominal hernia pain, denies problems with bowels or bladder.

## 2017-08-09 NOTE — ED Notes (Signed)
Pt AOx4, in bed with rails upx1, pt denies chest pain, shortness of breath, headache or syncope. We will continue to monitor the pt.

## 2017-08-09 NOTE — ED Triage Notes (Signed)
Pt reports has an inguinal hernia on the right side that is causing a lot of pain. Pt reports has known about it for 4 months and is not followed by a physician for it. Pt reports has probably done something to aggravate it although he does not know what. Denies other sx's. Denies urinary sx's.

## 2017-08-09 NOTE — ED Provider Notes (Signed)
[facility] Emergency Department Provider Note       Time seen: ----------------------------------------- 1:09 PM on 08/09/2017 -----------------------------------------   I have reviewed the triage vital signs and the nursing notes.  HISTORY   Chief Complaint Hernia    HPI Marcus Zavala is a 47 y.o. male with a history of aortic dissection, hypertension, sleep apnea who presents to the ED for abdominal pain coming from a hernia.  Patient states he has a inguinal hernia on his right side is causing a lot of pain.  He reports he is known about it for about 4 months but has not followed up with a physician about that.  He does not remember any specific activity aggravating the hernia.  Patient states yesterday he had increased pain in his right inguinal area.  Past Medical History:  Diagnosis Date  . Allergy   . Aortic dissection (HCC)   . Hypertension   . Sleep apnea    CPAP    Patient Active Problem List   Diagnosis Date Noted  . Abnormal kidney function study 05/25/2017  . Conjunctivitis 05/25/2017  . Ear ache 05/25/2017  . 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 Abdominal aortic aneurysm (AAA) without rupture (HCC) 11/30/2016  . Bilateral leg edema 07/23/2016  . Carotid artery dissection (HCC) 07/03/2016  . Family history of premature CAD 07/03/2016  . Heart palpitations 07/03/2016  . Mild aortic regurgitation 07/03/2016  . Diabetes mellitus type 2, controlled, without complications (HCC) 06/28/2016  . Sleep apnea in adult 04/07/2016  . Acute bronchitis 03/15/2016  . Low testosterone level in male 12/21/2015  . Family history of diabetes mellitus in mother 12/21/2015  . Acute recurrent frontal sinusitis 07/01/2015  . Acute right hip pain 07/01/2015  . Severe obesity (BMI 35.0-35.9 with comorbidity) (HCC) 07/01/2015  . Otitis media with effusion 03/10/2015  . Otitis externa of both ears 03/10/2015  . Hypertension 11/06/2014  . Aortic dissection (HCC)  11/06/2014  . Fatigue 11/06/2014  . Allergic rhinitis 11/06/2014  . Genetic counseling and testing 03/12/2013  . Insomnia 07/09/2012  . Anxiety 06/01/2012  . Testosterone deficiency 06/24/2011  . Vitamin D deficiency, unspecified 06/24/2011  . Dissection of thoracoabdominal aorta (HCC) 06/23/2011    Past Surgical History:  Procedure Laterality Date  . AORTA SURGERY  03/2009  . ETHMOIDECTOMY Bilateral 10/14/2016   Procedure: ETHMOIDECTOMY total with tissue removal;  Surgeon: Linus Salmons, MD;  Location: [facility];  Service: ENT;  Laterality: Bilateral;  . IMAGE GUIDED SINUS SURGERY N/A 10/14/2016   Procedure: IMAGE GUIDED SINUS SURGERY;  Surgeon: Linus Salmons, MD;  Location: [facility];  Service: ENT;  Laterality: N/A;  sleep apnea-CPAP NEED DISK NEED BALLOON REP PRESENT STRYKER GAVE DISK TO CECE 4-5 KP  . MAXILLARY ANTROSTOMY Bilateral 10/14/2016   Procedure: MAXILLARY ANTROSTOMY with tissue removal;  Surgeon: Linus Salmons, MD;  Location: [facility];  Service: ENT;  Laterality: Bilateral;  . NASAL TURBINATE REDUCTION Bilateral 10/14/2016   Procedure: SUBMUCOSAL RESECTION;  Surgeon: Linus Salmons, MD;  Location: [facility];  Service: ENT;  Laterality: Bilateral;  . SEPTOPLASTY N/A 10/14/2016   Procedure: SEPTOPLASTY revision;  Surgeon: Linus Salmons, MD;  Location: [facility];  Service: ENT;  Laterality: N/A;  . SINUSOTOMY Right 10/14/2016   Procedure: frontal SINUSOTOMY with possible balloon;  Surgeon: Linus Salmons, MD;  Location: [facility];  Service: ENT;  Laterality: Right;    Allergies Penicillins; Ciprofloxacin; and Lisinopril  Social History Social History   Tobacco Use  .  Smoking status: Never Smoker  . Smokeless tobacco: Current User  Substance Use Topics  . Alcohol use: No    Alcohol/week: 0.0 oz  . Drug use: No   Review of Systems Constitutional: Negative for fever. Cardiovascular:  Negative for chest pain. Respiratory: Negative for shortness of breath. Gastrointestinal: Negative for abdominal pain, vomiting and diarrhea. Genitourinary: Positive for right inguinal pain and swelling Musculoskeletal: Negative for back pain. Skin: Negative for rash. Neurological: Negative for headaches, focal weakness or numbness.  All systems negative/normal/unremarkable except as stated in the HPI  ____________________________________________   PHYSICAL EXAM:  VITAL SIGNS: ED Triage Vitals [08/09/17 1045]  Enc Vitals Group     BP 137/81     Pulse Rate 75     Resp 20     Temp 98.8 F (37.1 C)     Temp Source Oral     SpO2 98 %     Weight 270 lb (122.5 kg)     Height 6\' 3"  (1.905 m)     Head Circumference      Peak Flow      Pain Score 8     Pain Loc      Pain Edu?      Excl. in GC?    Constitutional: Alert and oriented. Well appearing and in no distress. Cardiovascular: Normal rate, regular rhythm. No murmurs, rubs, or gallops. Respiratory: Normal respiratory effort without tachypnea nor retractions. Breath sounds are clear and equal bilaterally. No wheezes/rales/rhonchi. Gastrointestinal: Soft and nontender. Normal bowel sounds Genitourinary: Very large reducible right inguinal hernia is noted.  Patient was placed in Trendelenburg and pressure was applied until the hernia resolved.  No testicular tenderness Musculoskeletal: Nontender with normal range of motion in extremities. No lower extremity tenderness nor edema. Neurologic:  Normal speech and language. No gross focal neurologic deficits are appreciated.  Skin:  Skin is warm, dry and intact. No rash noted. ___________________________________________  ED COURSE:  As part of my medical decision making, I reviewed the following data within the electronic MEDICAL RECORD NUMBER History obtained from family if available, nursing notes, old chart and ekg, as well as notes from prior ED visits. Patient presented for inguinal  hernia pain.  The hernia is large, chronic and reducible.  He is cleared for outpatient follow-up.   Procedures ____________________________________________   LABS (pertinent positives/negatives)  Labs Reviewed  COMPREHENSIVE METABOLIC PANEL - Abnormal; Notable for the following components:      Result Value   Glucose, Bld 165 (*)    All other components within normal limits  CBC   ____________________________________________  DIFFERENTIAL DIAGNOSIS   Inguinal hernia, incarcerated hernia, strangulation unlikely  FINAL ASSESSMENT AND PLAN  Inguinal hernia   Plan: The patient had presented for right-sided inguinal hernia. Patient's labs are reassuring and his exam is reassuring and that the inguinal hernia is reducible without much difficulty.  He is cleared for outpatient follow-up with general surgery.   Ulice DashJohnathan E Aurielle Slingerland, MD   Note: This note was generated in part or whole with voice recognition software. Voice recognition is usually quite accurate but there are transcription errors that can and very often do occur. I apologize for any typographical errors that were not detected and corrected.     Emily FilbertWilliams, Eadie Repetto E, MD 08/09/17 1322

## 2017-08-16 ENCOUNTER — Encounter: Payer: Self-pay | Admitting: Surgery

## 2017-08-16 ENCOUNTER — Ambulatory Visit (INDEPENDENT_AMBULATORY_CARE_PROVIDER_SITE_OTHER): Payer: 59 | Admitting: Surgery

## 2017-08-16 VITALS — BP 147/82 | HR 80 | Temp 97.5°F | Ht 75.0 in | Wt 276.2 lb

## 2017-08-16 DIAGNOSIS — K4090 Unilateral inguinal hernia, without obstruction or gangrene, not specified as recurrent: Secondary | ICD-10-CM

## 2017-08-16 NOTE — Patient Instructions (Signed)
You have chose to have your hernia repaired. This will be done by Dr. Excell Seltzerooper  on 08/30/17 at [facility].  Please see your (blue) Pre-care information that you have been given today.  You will need to arrange to be out of work for 2 weeks and then return with a lifting restrictions for 4 more weeks. Please send any FMLA paperwork prior to surgery and we will fill this out and fax it back to your employer within 3 business days.  You may have a bruise in your groin and also swelling and brusing in your testicle area. You may use ice 4-5 times daily for 15-20 minutes each time. Make sure that you place a barrier between you and the ice pack. To decrease the swelling, you may roll up a bath towel and place it vertically in between your thighs with your testicles resting on the towel. You will want to keep this area elevated as much as possible for several days following surgery.    Inguinal Hernia, Adult Muscles help keep everything in the body in its proper place. But if a weak spot in the muscles develops, something can poke through. That is called a hernia. When this happens in the lower part of the belly (abdomen), it is called an inguinal hernia. (It takes its name from a part of the body in this region called the inguinal canal.) A weak spot in the wall of muscles lets some fat or part of the small intestine bulge through. An inguinal hernia can develop at any age. Men get them more often than women. CAUSES  In adults, an inguinal hernia develops over time.  It can be triggered by:  Suddenly straining the muscles of the lower abdomen.  Lifting heavy objects.  Straining to have a bowel movement. Difficult bowel movements (constipation) can lead to this.  Constant coughing. This may be caused by smoking or lung disease.  Being overweight.  Being pregnant.  Working at a job that requires long periods of standing or heavy lifting.  Having had an inguinal hernia before. One type can be an  emergency situation. It is called a strangulated inguinal hernia. It develops if part of the small intestine slips through the weak spot and cannot get back into the abdomen. The blood supply can be cut off. If that happens, part of the intestine may die. This situation requires emergency surgery. SYMPTOMS  Often, a small inguinal hernia has no symptoms. It is found when a healthcare provider does a physical exam. Larger hernias usually have symptoms.   In adults, symptoms may include:  A lump in the groin. This is easier to see when the person is standing. It might disappear when lying down.  In men, a lump in the scrotum.  Pain or burning in the groin. This occurs especially when lifting, straining or coughing.  A dull ache or feeling of pressure in the groin.  Signs of a strangulated hernia can include:  A bulge in the groin that becomes very painful and tender to the touch.  A bulge that turns red or purple.  Fever, nausea and vomiting.  Inability to have a bowel movement or to pass gas. DIAGNOSIS  To decide if you have an inguinal hernia, a healthcare provider will probably do a physical examination.  This will include asking questions about any symptoms you have noticed.  The healthcare provider might feel the groin area and ask you to cough. If an inguinal hernia is felt, the healthcare  provider may try to slide it back into the abdomen.  Usually no other tests are needed. TREATMENT  Treatments can vary. The size of the hernia makes a difference. Options include:  Watchful waiting. This is often suggested if the hernia is small and you have had no symptoms.  No medical procedure will be done unless symptoms develop.  You will need to watch closely for symptoms. If any occur, contact your healthcare provider right away.  Surgery. This is used if the hernia is larger or you have symptoms.  Open surgery. This is usually an outpatient procedure (you will not stay  overnight in a hospital). An cut (incision) is made through the skin in the groin. The hernia is put back inside the abdomen. The weak area in the muscles is then repaired by herniorrhaphy or hernioplasty. Herniorrhaphy: in this type of surgery, the weak muscles are sewn back together. Hernioplasty: a patch or mesh is used to close the weak area in the abdominal wall.  Laparoscopy. In this procedure, a surgeon makes small incisions. A thin tube with a tiny video camera (called a laparoscope) is put into the abdomen. The surgeon repairs the hernia with mesh by looking with the video camera and using two long instruments. HOME CARE INSTRUCTIONS   After surgery to repair an inguinal hernia:  You will need to take pain medicine prescribed by your healthcare provider. Follow all directions carefully.  You will need to take care of the wound from the incision.  Your activity will be restricted for awhile. This will probably include no heavy lifting for several weeks. You also should not do anything too active for a few weeks. When you can return to work will depend on the type of job that you have.  During "watchful waiting" periods, you should:  Maintain a healthy weight.  Eat a diet high in fiber (fruits, vegetables and whole grains).  Drink plenty of fluids to avoid constipation. This means drinking enough water and other liquids to keep your urine clear or pale yellow.  Do not lift heavy objects.  Do not stand for long periods of time.  Quit smoking. This should keep you from developing a frequent cough. SEEK MEDICAL CARE IF:   A bulge develops in your groin area.  You feel pain, a burning sensation or pressure in the groin. This might be worse if you are lifting or straining.  You develop a fever of more than 100.5 F (38.1 C). SEEK IMMEDIATE MEDICAL CARE IF:   Pain in the groin increases suddenly.  A bulge in the groin gets bigger suddenly and does not go down.  For men, there  is sudden pain in the scrotum. Or, the size of the scrotum increases.  A bulge in the groin area becomes red or purple and is painful to touch.  You have nausea or vomiting that does not go away.  You feel your heart beating much faster than normal.  You cannot have a bowel movement or pass gas.  You develop a fever of more than 102.0 F (38.9 C).   This information is not intended to replace advice given to you by your health care provider. Make sure you discuss any questions you have with your health care provider.   Document Released: 07/10/2008 Document Revised: 05/16/2011 Document Reviewed: 08/25/2014 Elsevier Interactive Patient Education [employer].

## 2017-08-16 NOTE — Progress Notes (Signed)
Cardiac Clearance faxed to Dr.Vemulapalli at this time. Fax # [phone]

## 2017-08-16 NOTE — Progress Notes (Signed)
Marcus Zavala is an 47 y.o. male.   Referred by ED Chief Complaint: RIH HPI: Patient states he is known about this for a few months and not before that but in the last month he has had pain.  He went to the ED 1 day when he had dry heaves.  It does not prevent him from working at [employer].  He does not smoke but dips tobacco does not drink alcohol.  He had an aortic dissection which was repaired via median sternotomy many years ago.  He is not anticoagulated  Past Medical History:  Diagnosis Date  . Abdominal aortic aneurysm (AAA) without rupture (HCC) 11/30/2016  . Allergic rhinitis 11/06/2014  . Allergy   . Aortic dissection (HCC)   . Diabetes mellitus type 2, controlled, without complications (HCC) 06/28/2016  . Hypertension   . Sleep apnea    CPAP  . Testosterone deficiency 06/24/2011   Overview:  Serum testosterone 159 , Hgb 13.5, PSA 1.1 07/2011 Replacement start 07/2011 Axiron too expensive. Alternate agent requested, Androderm and androgel were not effective in the past.   . Vitamin D deficiency, unspecified 06/24/2011    Past Surgical History:  Procedure Laterality Date  . AORTA SURGERY  03/2009  . ETHMOIDECTOMY Bilateral 10/14/2016   Procedure: ETHMOIDECTOMY total with tissue removal;  Surgeon: Linus SalmonsMcQueen, Chapman, MD;  [facility];  Service: ENT;  Laterality: Bilateral;  . IMAGE GUIDED SINUS SURGERY N/A 10/14/2016   Procedure: IMAGE GUIDED SINUS SURGERY;  Surgeon: Linus SalmonsMcQueen, Chapman, MD;  [facility];  Service: ENT;  Laterality: N/A;  sleep apnea-CPAP NEED DISK NEED BALLOON REP PRESENT STRYKER GAVE DISK TO CECE 4-5 KP  . MAXILLARY ANTROSTOMY Bilateral 10/14/2016   Procedure: MAXILLARY ANTROSTOMY with tissue removal;  Surgeon: Linus SalmonsMcQueen, Chapman, MD;  [facility];  Service: ENT;  Laterality: Bilateral;  . NASAL TURBINATE REDUCTION Bilateral 10/14/2016   Procedure: SUBMUCOSAL RESECTION;  Surgeon: Linus SalmonsMcQueen, Chapman, MD;   [facility];  Service: ENT;  Laterality: Bilateral;  . SEPTOPLASTY N/A 10/14/2016   Procedure: SEPTOPLASTY revision;  Surgeon: Linus SalmonsMcQueen, Chapman, MD;  [facility];  Service: ENT;  Laterality: N/A;  . SINUSOTOMY Right 10/14/2016   Procedure: frontal SINUSOTOMY with possible balloon;  Surgeon: Linus SalmonsMcQueen, Chapman, MD;  [facility];  Service: ENT;  Laterality: Right;    Family History  Problem Relation Age of Onset  . Diabetes Mother   . Heart disease Father   . Cancer Father        Lymphatic  . Heart disease Brother   . Diabetes Brother    Social History:  reports that he has never smoked. He uses smokeless tobacco. He reports that he does not drink alcohol or use drugs.  Allergies:  Allergies  Allergen Reactions  . Penicillins Hives  . Lisinopril Swelling    Hand swelling  . Ciprofloxacin Nausea Only     (Not in a hospital admission)   Review of Systems:   Review of Systems  Constitutional: Negative for chills, fever and weight loss.  HENT: Negative.   Eyes: Negative.   Respiratory: Negative.   Cardiovascular: Negative.   Gastrointestinal: Negative for abdominal pain, constipation, diarrhea, heartburn, nausea and vomiting.  Genitourinary: Negative.   Musculoskeletal: Negative.   Skin: Negative.   Neurological: Negative.   Endo/Heme/Allergies: Negative.   Psychiatric/Behavioral: Negative.     Physical Exam:  Physical Exam  Constitutional: He is oriented to person, place, and time. He appears well-developed  and well-nourished. No distress.  Obese  HENT:  Head: Normocephalic and atraumatic.  Eyes: Pupils are equal, round, and reactive to light. Right eye exhibits no discharge. Left eye exhibits no discharge.  Neck: Normal range of motion.  Cardiovascular: Normal rate, regular rhythm and normal heart sounds.  Pulmonary/Chest: Effort normal and breath sounds normal. No stridor. No respiratory distress. He has no  wheezes.  Median sternotomy  Abdominal: Soft. He exhibits no distension and no mass. There is no tenderness. There is no guarding.  Genitourinary: Penis normal.  Genitourinary Comments: Huge right scrotal hernia which is completely reducible dermal testicles bilaterally no left inguinal hernia  Musculoskeletal: Normal range of motion. He exhibits no edema.  Lymphadenopathy:    He has no cervical adenopathy.  Neurological: He is alert and oriented to person, place, and time.  Skin: Skin is warm and dry. He is not diaphoretic. No erythema.  Vitals reviewed.   There were no vitals taken for this visit.    No results found for this or any previous visit (from the past 48 hour(s)). No results found.   Assessment/Plan This a patient with a large scrotal hernia on the right.  It is symptomatic and requires repair.  He does not take any medications except for his hypertension at this time but does have a significant history of an aortic dissection.  He never had a stroke.  He is not anticoagulated.  I discussed with him the rationale for offering surgery and the options of observation the risk of bleeding infection recurrence ischemic orchitis chronic pain syndrome neuroma and the high likelihood of seroma postoperatively and these large scrotal hernias he understood and agreed to proceed  Lattie Haw, MD, FACS

## 2017-08-17 ENCOUNTER — Telehealth: Payer: Self-pay | Admitting: Surgery

## 2017-08-17 NOTE — Telephone Encounter (Signed)
Pt advised of pre op date/time and sx date. Sx: 08/30/17 with Dr Ludwig Clarksooper--laparoscopic right inguinal hernia repair.  Pre op: 08/21/17 @ 8:00am--office interview.   Patient made aware to call [phone], between 1-3:00pm the day before surgery, to find out what time to arrive.    Patient has agreed to make a deposit for surgery prior to surgery date. 350.00

## 2017-08-21 ENCOUNTER — Inpatient Hospital Stay: Admission: RE | Admit: 2017-08-21 | Payer: 59 | Source: Ambulatory Visit

## 2017-08-22 ENCOUNTER — Other Ambulatory Visit: Payer: Self-pay

## 2017-08-22 ENCOUNTER — Telehealth: Payer: Self-pay

## 2017-08-22 ENCOUNTER — Encounter
Admission: RE | Admit: 2017-08-22 | Discharge: 2017-08-22 | Disposition: A | Discharge status: Home / Self Care | Payer: 59 | Source: Ambulatory Visit | Attending: Surgery | Admitting: Surgery

## 2017-08-22 DIAGNOSIS — Z01810 Encounter for preprocedural cardiovascular examination: Secondary | ICD-10-CM | POA: Diagnosis present

## 2017-08-22 DIAGNOSIS — I4510 Unspecified right bundle-branch block: Secondary | ICD-10-CM | POA: Insufficient documentation

## 2017-08-22 NOTE — Telephone Encounter (Signed)
Cardiac Clearance faxed to Dr. Vic RipperSeerkanth Vemulapalli 2 [phone].

## 2017-08-22 NOTE — Patient Instructions (Signed)
Your procedure is scheduled on: Wed. 6/26 Report to Day Surgery. To find out your arrival time please call [phone] between 1PM - 3PM on Tues 6/25.  Remember: Instructions that are not followed completely may result in serious medical risk, up to and including death, or upon the discretion of your surgeon and anesthesiologist your surgery may need to be rescheduled.     _X__ 1. Do not eat food after midnight the night before your procedure.                 No gum chewing or hard candies. You may drink clear liquids up to 2 hours                 before you are scheduled to arrive for your surgery- DO not drink clear                 liquids within 2 hours of the start of your surgery.                 Clear Liquids include:  water,  __X__2.  On the morning of surgery brush your teeth with toothpaste and water, you  may rinse your mouth with mouthwash if you wish.  Do not swallow any              toothpaste of mouthwash.     _X__ 3.  No Alcohol for 24 hours before or after surgery.   _X__ 4.  Do Not Smoke or use e-cigarettes For 24 Hours Prior to Your Surgery.                 Do not use any chewable tobacco products for at least 6 hours prior to                 surgery.  ____  5.  Bring all medications with you on the day of surgery if instructed.   __x__  6.  Notify your doctor if there is any change in your medical condition      (cold, fever, infections).     Do not wear jewelry, make-up, hairpins, clips or nail polish. Do not wear lotions, powders, or perfumes. You may wear deodorant. Do not shave 48 hours prior to surgery. Men may shave face and neck. Do not bring valuables to the hospital.    [facility] any belongings or valuables.  Contacts, dentures or bridgework may not be worn into surgery. Leave your suitcase in the car. After surgery it may be brought to your room. For patients admitted to the hospital, discharge time is  determined by your treatment team.   Patients discharged the day of surgery will not be allowed to drive home.   Please read over the following fact sheets that you were given:    _x___ Take these medicines the morning of surgery with A SIP OF WATER:    1.none  2.   3.   4.  5.  6.  ____ Fleet Enema (as directed)   __x__ Use CHG Soap as directed  ____ Use inhalers on the day of surgery  ____ Stop metformin 2 days prior to surgery    ____ Take 1/2 of usual insulin dose the night before surgery. No insulin the morning          of surgery.   ____ Stop Coumadin/Plavix/aspirin on   __x__ Stop Anti-inflammatories on Ibuprofen or Aleve BC /  May take Tylenol  ____ Stop supplements until after surgery.    __x__ Bring C-Pap to the hospital.

## 2017-08-22 NOTE — Pre-Procedure Instructions (Signed)
CLEARANCE REQUEST CALLED AND FAXED TO CARDIOLOGIST AND FYI FAXED TO DR Earmon PhoenixOOPER'S

## 2017-08-24 NOTE — Telephone Encounter (Signed)
Spoke with patient to see if he had been contacted by Dr.Vemulapalli regarding EKG.  Patient stated he will call the office today to see if he needs an appointment prior to receiving Cardiac clearance for surgery.

## 2017-08-29 ENCOUNTER — Telehealth: Payer: Self-pay | Admitting: Surgery

## 2017-08-29 ENCOUNTER — Telehealth: Payer: Self-pay

## 2017-08-29 NOTE — Telephone Encounter (Signed)
Patient called and made payment over the phone for his surgery.

## 2017-08-29 NOTE — Pre-Procedure Instructions (Signed)
FAXED REQUEST TO TARA TURNER TO GET CLEARANCE NOTE ASAP TODAY

## 2017-08-29 NOTE — Pre-Procedure Instructions (Signed)
Clearance by cardiology on chart

## 2017-08-29 NOTE — Telephone Encounter (Signed)
Spoke with Marcus MessierKathy  Dr.Vemulapalli nurse   regarding Cardiac Clearance. She stated the clearance was faxed to our office on 6/20. I ask her to refax to [phone] an d she stated she would do so.

## 2017-08-30 ENCOUNTER — Encounter
Admission: RE | Disposition: A | Discharge status: Home / Self Care | Payer: Self-pay | Source: Ambulatory Visit | Attending: Surgery

## 2017-08-30 ENCOUNTER — Ambulatory Visit
Admission: RE | Admit: 2017-08-30 | Discharge: 2017-08-30 | Disposition: A | Discharge status: Home / Self Care | Payer: 59 | Source: Ambulatory Visit | Attending: Surgery | Admitting: Surgery

## 2017-08-30 ENCOUNTER — Encounter: Payer: Self-pay | Admitting: Anesthesiology

## 2017-08-30 ENCOUNTER — Ambulatory Visit: Payer: 59 | Admitting: Registered Nurse

## 2017-08-30 DIAGNOSIS — G4730 Sleep apnea, unspecified: Secondary | ICD-10-CM | POA: Insufficient documentation

## 2017-08-30 DIAGNOSIS — K4090 Unilateral inguinal hernia, without obstruction or gangrene, not specified as recurrent: Secondary | ICD-10-CM | POA: Diagnosis present

## 2017-08-30 DIAGNOSIS — Z880 Allergy status to penicillin: Secondary | ICD-10-CM | POA: Insufficient documentation

## 2017-08-30 DIAGNOSIS — I10 Essential (primary) hypertension: Secondary | ICD-10-CM | POA: Insufficient documentation

## 2017-08-30 DIAGNOSIS — Z881 Allergy status to other antibiotic agents status: Secondary | ICD-10-CM | POA: Diagnosis not present

## 2017-08-30 DIAGNOSIS — E119 Type 2 diabetes mellitus without complications: Secondary | ICD-10-CM | POA: Insufficient documentation

## 2017-08-30 DIAGNOSIS — Z833 Family history of diabetes mellitus: Secondary | ICD-10-CM | POA: Diagnosis not present

## 2017-08-30 HISTORY — PX: INGUINAL HERNIA REPAIR: SHX194

## 2017-08-30 LAB — GLUCOSE, CAPILLARY
GLUCOSE-CAPILLARY: 155 mg/dL — AB (ref 70–99)
Glucose-Capillary: 142 mg/dL — ABNORMAL HIGH (ref 70–99)

## 2017-08-30 SURGERY — REPAIR, HERNIA, INGUINAL, LAPAROSCOPIC
Anesthesia: General | Laterality: Right

## 2017-08-30 MED ORDER — MIDAZOLAM HCL 2 MG/2ML IJ SOLN
INTRAMUSCULAR | Status: AC
  Filled 2017-08-30: qty 2

## 2017-08-30 MED ORDER — FENTANYL CITRATE (PF) 100 MCG/2ML IJ SOLN
INTRAMUSCULAR | Status: AC
  Filled 2017-08-30: qty 2

## 2017-08-30 MED ORDER — SUCCINYLCHOLINE CHLORIDE 20 MG/ML IJ SOLN
INTRAMUSCULAR | Status: DC | PRN
  Administered 2017-08-30: 140 mg via INTRAVENOUS

## 2017-08-30 MED ORDER — ONDANSETRON HCL 4 MG/2ML IJ SOLN: 4.0000 mg | Freq: Once | INTRAMUSCULAR | Status: DC | PRN

## 2017-08-30 MED ORDER — FENTANYL CITRATE (PF) 100 MCG/2ML IJ SOLN
INTRAMUSCULAR | Status: DC | PRN
  Administered 2017-08-30: 25 ug via INTRAVENOUS
  Administered 2017-08-30: 100 ug via INTRAVENOUS
  Administered 2017-08-30: 25 ug via INTRAVENOUS
  Administered 2017-08-30: 50 ug via INTRAVENOUS

## 2017-08-30 MED ORDER — KETOROLAC TROMETHAMINE 30 MG/ML IJ SOLN
INTRAMUSCULAR | Status: AC
  Filled 2017-08-30: qty 1

## 2017-08-30 MED ORDER — PROPOFOL 10 MG/ML IV BOLUS
INTRAVENOUS | Status: AC
  Filled 2017-08-30: qty 20

## 2017-08-30 MED ORDER — ACETAMINOPHEN 10 MG/ML IV SOLN
INTRAVENOUS | Status: AC
  Filled 2017-08-30: qty 100

## 2017-08-30 MED ORDER — DEXAMETHASONE SODIUM PHOSPHATE 10 MG/ML IJ SOLN
INTRAMUSCULAR | Status: AC
  Filled 2017-08-30: qty 1

## 2017-08-30 MED ORDER — LIDOCAINE HCL (PF) 2 % IJ SOLN
INTRAMUSCULAR | Status: AC
  Filled 2017-08-30: qty 10

## 2017-08-30 MED ORDER — BUPIVACAINE-EPINEPHRINE (PF) 0.25% -1:200000 IJ SOLN
INTRAMUSCULAR | Status: AC
  Filled 2017-08-30: qty 30

## 2017-08-30 MED ORDER — PROPOFOL 10 MG/ML IV BOLUS
INTRAVENOUS | Status: DC | PRN
  Administered 2017-08-30: 200 mg via INTRAVENOUS

## 2017-08-30 MED ORDER — BUPIVACAINE-EPINEPHRINE (PF) 0.25% -1:200000 IJ SOLN
INTRAMUSCULAR | Status: DC | PRN
  Administered 2017-08-30: 30 mL

## 2017-08-30 MED ORDER — HEPARIN SODIUM (PORCINE) 5000 UNIT/ML IJ SOLN
5000.0000 [IU] | Freq: Once | INTRAMUSCULAR | Status: AC
  Administered 2017-08-30: 5000 [IU] via SUBCUTANEOUS

## 2017-08-30 MED ORDER — SODIUM CHLORIDE 0.9 % IV SOLN
INTRAVENOUS | Status: DC
  Administered 2017-08-30: 08:00:00 via INTRAVENOUS

## 2017-08-30 MED ORDER — KETOROLAC TROMETHAMINE 30 MG/ML IJ SOLN
INTRAMUSCULAR | Status: DC | PRN
  Administered 2017-08-30: 30 mg via INTRAVENOUS

## 2017-08-30 MED ORDER — EPHEDRINE SULFATE 50 MG/ML IJ SOLN
INTRAMUSCULAR | Status: AC
  Filled 2017-08-30: qty 1

## 2017-08-30 MED ORDER — FAMOTIDINE 20 MG PO TABS
20.0000 mg | ORAL_TABLET | Freq: Once | ORAL | Status: AC
  Administered 2017-08-30: 20 mg via ORAL

## 2017-08-30 MED ORDER — CHLORHEXIDINE GLUCONATE CLOTH 2 % EX PADS: 6.0000 | MEDICATED_PAD | Freq: Once | CUTANEOUS | Status: DC

## 2017-08-30 MED ORDER — LACTATED RINGERS IV SOLN
INTRAVENOUS | Status: DC | PRN
  Administered 2017-08-30: 10:00:00 via INTRAVENOUS

## 2017-08-30 MED ORDER — ONDANSETRON HCL 4 MG/2ML IJ SOLN
INTRAMUSCULAR | Status: DC | PRN
  Administered 2017-08-30: 4 mg via INTRAVENOUS

## 2017-08-30 MED ORDER — FENTANYL CITRATE (PF) 100 MCG/2ML IJ SOLN: 25.0000 ug | INTRAMUSCULAR | Status: DC | PRN

## 2017-08-30 MED ORDER — SUGAMMADEX SODIUM 200 MG/2ML IV SOLN
INTRAVENOUS | Status: AC
  Filled 2017-08-30: qty 2

## 2017-08-30 MED ORDER — DEXAMETHASONE SODIUM PHOSPHATE 10 MG/ML IJ SOLN
INTRAMUSCULAR | Status: DC | PRN
  Administered 2017-08-30: 4 mg via INTRAVENOUS

## 2017-08-30 MED ORDER — ONDANSETRON HCL 4 MG/2ML IJ SOLN
INTRAMUSCULAR | Status: AC
  Filled 2017-08-30: qty 2

## 2017-08-30 MED ORDER — HEPARIN SODIUM (PORCINE) 5000 UNIT/ML IJ SOLN
INTRAMUSCULAR | Status: AC
  Filled 2017-08-30: qty 1

## 2017-08-30 MED ORDER — ROCURONIUM BROMIDE 100 MG/10ML IV SOLN
INTRAVENOUS | Status: DC | PRN
  Administered 2017-08-30: 20 mg via INTRAVENOUS
  Administered 2017-08-30: 25 mg via INTRAVENOUS
  Administered 2017-08-30: 5 mg via INTRAVENOUS
  Administered 2017-08-30: 20 mg via INTRAVENOUS

## 2017-08-30 MED ORDER — MIDAZOLAM HCL 2 MG/2ML IJ SOLN
INTRAMUSCULAR | Status: DC | PRN
  Administered 2017-08-30: 2 mg via INTRAVENOUS

## 2017-08-30 MED ORDER — PHENYLEPHRINE HCL 10 MG/ML IJ SOLN
INTRAMUSCULAR | Status: DC | PRN
  Administered 2017-08-30 (×3): 100 ug via INTRAVENOUS

## 2017-08-30 MED ORDER — CLINDAMYCIN PHOSPHATE 900 MG/50ML IV SOLN
INTRAVENOUS | Status: AC
  Filled 2017-08-30: qty 50

## 2017-08-30 MED ORDER — LIDOCAINE HCL (CARDIAC) PF 100 MG/5ML IV SOSY
PREFILLED_SYRINGE | INTRAVENOUS | Status: DC | PRN
  Administered 2017-08-30: 100 mg via INTRAVENOUS

## 2017-08-30 MED ORDER — HYDROCODONE-ACETAMINOPHEN 5-300 MG PO TABS: 1.0000 | ORAL_TABLET | ORAL | 0 refills | Status: AC | PRN

## 2017-08-30 MED ORDER — EPHEDRINE SULFATE 50 MG/ML IJ SOLN
INTRAMUSCULAR | Status: DC | PRN
  Administered 2017-08-30: 7.5 mg via INTRAVENOUS

## 2017-08-30 MED ORDER — ACETAMINOPHEN 10 MG/ML IV SOLN
INTRAVENOUS | Status: DC | PRN
  Administered 2017-08-30: 1000 mg via INTRAVENOUS

## 2017-08-30 MED ORDER — CLINDAMYCIN PHOSPHATE 900 MG/50ML IV SOLN
900.0000 mg | INTRAVENOUS | Status: AC
  Administered 2017-08-30: 900 mg via INTRAVENOUS

## 2017-08-30 MED ORDER — SUGAMMADEX SODIUM 500 MG/5ML IV SOLN
INTRAVENOUS | Status: DC | PRN
  Administered 2017-08-30: 400 mg via INTRAVENOUS

## 2017-08-30 MED ORDER — FAMOTIDINE 20 MG PO TABS
ORAL_TABLET | ORAL | Status: AC
  Administered 2017-08-30: 20 mg via ORAL
  Filled 2017-08-30: qty 1

## 2017-08-30 MED ORDER — ROCURONIUM BROMIDE 50 MG/5ML IV SOLN
INTRAVENOUS | Status: AC
  Filled 2017-08-30: qty 1

## 2017-08-30 MED ORDER — SUCCINYLCHOLINE CHLORIDE 20 MG/ML IJ SOLN
INTRAMUSCULAR | Status: AC
  Filled 2017-08-30: qty 1

## 2017-08-30 SURGICAL SUPPLY — 34 items
ADHESIVE MASTISOL STRL (MISCELLANEOUS) ×2 IMPLANT
BLADE CLIPPER SURG (BLADE) ×2 IMPLANT
BLADE SURG SZ11 CARB STEEL (BLADE) ×2 IMPLANT
CHLORAPREP W/TINT 26ML (MISCELLANEOUS) ×2 IMPLANT
DISSECT BALLN SPACEMKR OVL PDB (BALLOONS)
DISSECT BALLN SPACEMKR RND PDB (MISCELLANEOUS) ×2
DISSECTOR BALLN SPCMKR OVL PDB (BALLOONS) IMPLANT
DISSECTOR BALLN SPCMKR RND PDB (MISCELLANEOUS) ×1 IMPLANT
GLOVE BIO SURGEON STRL SZ8 (GLOVE) ×2 IMPLANT
GOWN STRL REUS W/ TWL LRG LVL3 (GOWN DISPOSABLE) ×2 IMPLANT
GOWN STRL REUS W/TWL LRG LVL3 (GOWN DISPOSABLE) ×2
IRRIGATION STRYKERFLOW (MISCELLANEOUS) IMPLANT
IRRIGATOR STRYKERFLOW (MISCELLANEOUS)
LABEL OR SOLS (LABEL) ×2 IMPLANT
MESH HERNIA 4X6 PROLITE RECT (Mesh General) ×1 IMPLANT
MESH POLY 4X6 (Mesh General) ×1 IMPLANT
NEEDLE HYPO 22GX1.5 SAFETY (NEEDLE) ×2 IMPLANT
NS IRRIG 500ML POUR BTL (IV SOLUTION) ×2 IMPLANT
PACK LAP CHOLECYSTECTOMY (MISCELLANEOUS) ×2 IMPLANT
SPONGE GAUZE 2X2 8PLY STRL LF (GAUZE/BANDAGES/DRESSINGS) ×4 IMPLANT
SPONGE LAP 18X18 RF (DISPOSABLE) ×2 IMPLANT
STRIP CLOSURE SKIN 1/2X4 (GAUZE/BANDAGES/DRESSINGS) ×2 IMPLANT
SURGILUBE 2OZ TUBE FLIPTOP (MISCELLANEOUS) ×2 IMPLANT
SUT ETHIBOND 0 (SUTURE) ×2 IMPLANT
SUT MNCRL 4-0 (SUTURE) ×2
SUT MNCRL 4-0 27XMFL (SUTURE) ×2
SUT VICRYL 0 AB UR-6 (SUTURE) ×2 IMPLANT
SUTURE MNCRL 4-0 27XMF (SUTURE) ×2 IMPLANT
TACKER 5MM HERNIA 3.5CML NAB (ENDOMECHANICALS) ×2 IMPLANT
TRAY FOLEY MTR SLVR 16FR STAT (SET/KITS/TRAYS/PACK) ×2 IMPLANT
TROCAR 5MM SINGLE VERSAONE (TROCAR) ×4 IMPLANT
TROCAR BALLN 10M OMST10SB SPAC (TROCAR) ×2 IMPLANT
TUBING INSUFFLATION (TUBING) ×2 IMPLANT
capsure ×2 IMPLANT

## 2017-08-30 NOTE — Progress Notes (Signed)
Preoperative Review   Patient is met in the preoperative holding area. The history is reviewed in the chart and with the patient.  He has a large right scrotal hernia and would benefit from repair.  In spite of his multiple medical problems in the past he is currently only being treated for hypertension.  He is a perfect candidate for a laparoscopic approach.  I personally reviewed the options and rationale as well as the risks of this procedure that have been previously discussed with the patient.  In the presence of his wife we reviewed the rationale again and the options as well as the risks of bleeding infection recurrence the high risk of a hematoma or seroma in this large scrotal empty space once the hernia is reduced and repaired.  Ischemic orchitis was discussed as well.  Recurrence risks were reviewed.  Patient was marked on the right side.  All questions asked by the patient and/or family were answered to their satisfaction.  Patient agrees to proceed with this procedure at this time.  Florene Glen M.D. FACS

## 2017-08-30 NOTE — Anesthesia Post-op Follow-up Note (Signed)
Anesthesia QCDR form completed.        

## 2017-08-30 NOTE — Op Note (Signed)
Laparoscopic Inguinal Hernia Repair  Marcus Zavala  08/30/2017  Pre-operative Diagnosis: Right scrotal Inguinal Hernia  Post-operative Diagnosis: Right scrotal Inguinal hernia  Procedure: Laparoscopic preperitoneal repair of right scrotal  inguinal hernia   Surgeon: Adah Salvageichard E. Excell Seltzerooper, MD FACS  Anesthesia: Gen. with endotracheal tube  Assistant:surgical tech  Procedure Details  The patient was seen again in the Holding Room. The benefits, complications, treatment options, and expected outcomes were discussed with the patient. The risks of bleeding, infection, recurrence of symptoms, failure to resolve symptoms, recurrence of hernia, ischemic orchitis, chronic pain syndrome or neuroma, were discussed again. The strong likelihood of a postop seroma or hematoma was rev'd.The likelihood of improving the patient's symptoms with return to their baseline status is good.  The patient and/or family concurred with the proposed plan, giving informed consent.  The patient was taken to Operating Room, identified as Marcus Zavala and the procedure verified as Laparoscopic Inguinal Hernia Repair. Laterality confirmed.  A Time Out was held and the above information confirmed.  Prior to the induction of general anesthesia, antibiotic prophylaxis was administered. VTE prophylaxis was in place. General endotracheal anesthesia was then administered and tolerated well. A Foley catheter was placed by the nursing staff. After the induction, the abdomen was prepped with Chloraprep and draped in the sterile fashion. The patient was positioned in the supine position.  Local anesthetic  was injected into the skin near the umbilicus and an incision made. An incision was made and dissection down to the rectus fascia was performed. The fascia was incised and the muscle retracted laterally. The Covidien dissecting balloon was placed followed by the structural balloon. The preperitoneal space was insufflated and under  direct vision 2 midline 5 mm ports were placed.  Dissection was performed to delineate Marcus Zavala's ligament and the lateral extent of dissection was determined. The nerve on the lateral abdominal wall was identified and kept in view at all times. The cord was skeletonized of the huge scrotal indirect sac and cord lipoma which was retracted cephalad.   Once this was complete, a laterally scissored Atrium mesh was placed into the preperitoneal space. It was held in place with the Bard tacking device avoiding the area of the nerve. Once assuring that the hernia was completely repaired and adequately covered, the preperitoneal space was desufflated under direct vision. There was no sign of peritoneal rent and no sign of bowel intrusion towards the mesh.  Once assuring that hemostasis was adequate the ports were removed and a figure-of-eight 0 Vicryl suture was placed at the fascial edges. 4-0 subcuticular Monocryl was used at all skin edges. Steri-Strips and Mastisol and sterile dressings were placed.  Patient tolerated the procedure well. There were no complications. He was taken to the recovery room in stable condition to be discharged to the care of his family and follow-up in 10 days.    Findings: huge right scrotal indirect hernia                      Marcus Zavala E. Excell Seltzerooper, MD, FACS

## 2017-08-30 NOTE — Anesthesia Postprocedure Evaluation (Signed)
Anesthesia Post Note  Patient: Marcus Zavala  Procedure(s) Performed: LAPAROSCOPIC INGUINAL HERNIA SCROTAL (Right )  Patient location during evaluation: PACU Anesthesia Type: General Level of consciousness: awake and alert and oriented Pain management: pain level controlled Vital Signs Assessment: post-procedure vital signs reviewed and stable Respiratory status: spontaneous breathing Cardiovascular status: blood pressure returned to baseline Anesthetic complications: no     Last Vitals:  Vitals:   08/30/17 1150 08/30/17 1219  BP: 127/65 119/64  Pulse: 68 (!) 59  Resp: 16 16  Temp: (!) 35.8 C (!) 35.9 C  SpO2: 96% 96%    Last Pain:  Vitals:   08/30/17 1219  TempSrc: Temporal  PainSc: 0-No pain                 Zaleigh Bermingham

## 2017-08-30 NOTE — Transfer of Care (Signed)
Immediate Anesthesia Transfer of Care Note  Patient: Marcus OtaMark C Boniface  Procedure(s) Performed: LAPAROSCOPIC INGUINAL HERNIA SCROTAL (Right )  Patient Location: PACU  Anesthesia Type:General  Level of Consciousness: awake  Airway & Oxygen Therapy: Patient Spontanous Breathing  Post-op Assessment: Report given to RN  Post vital signs: stable  Last Vitals:  Vitals Value Taken Time  BP 117/73 08/30/2017 10:52 AM  Temp 36.6 C 08/30/2017 10:52 AM  Pulse 73 08/30/2017 10:57 AM  Resp 14 08/30/2017 10:57 AM  SpO2 98 % 08/30/2017 10:57 AM  Vitals shown include unvalidated device data.  Last Pain:  Vitals:   08/30/17 0800  TempSrc: Oral  PainSc: 7          Complications: No apparent anesthesia complications

## 2017-08-30 NOTE — Anesthesia Preprocedure Evaluation (Signed)
Anesthesia Evaluation  Patient identified by MRN, date of birth, ID band Patient awake    Reviewed: Allergy & Precautions, NPO status , Patient's Chart, lab work & pertinent test results  Airway Mallampati: II  TM Distance: >3 FB     Dental   Pulmonary sleep apnea ,  Uses tobacco dip   Pulmonary exam normal        Cardiovascular hypertension, + Peripheral Vascular Disease (aortic dissection s/p repair 2011)  Normal cardiovascular exam+ Valvular Problems/Murmurs AI   CT TAA 08/31/16:  1. Stable appearance of the thoracoabdominal aorta status post open repair of the ascending aorta with a persistent dissection extending along the aortic arch and descending aorta and the abdominal aorta and through the midportion of the left external iliac artery. Proximal descending aorta, infrarenal abdominal aorta and left common iliac artery remain enlarged and are not significantly changed from prior.  US aorta 08/31/16:  The largest AP measurement was 4.69 cm in  maximum diameter.    Neuro/Psych    GI/Hepatic negative GI ROS, Neg liver ROS,   Endo/Other  diabetesMorbid obesity (BMI 35)  Renal/GU negative Renal ROS     Musculoskeletal negative musculoskeletal ROS (+)   Abdominal   Peds  Hematology   Anesthesia Other Findings From cardiology note 07/20/16:  Marcus GriffinsMark Zavala is a 47 y.o. male with known Thoracoabdominal Aortic Dissection s/p Ascending Aortic repair 2011, Mild AR, HTN and OSA who presents today for a follow-up visit.   1. R>L LE Edema-called yesterday [place] 10 [place] standing on his feet for at least 10 hours a day. He started noticing noting right greater than left edema just above the ankle that was tender to touch. The edema did not resolve overnight. He denies any right leg trauma. He has been taking amlodipine for many years and has never had lower extremity  edema.Recommendations:  A. Bilat LE Venous Duplex today B. Salt restriction C. If venous duplex negative, consider PCP evaluation for M/S cause of leg edema. D. We may consider reducing amlodipine if Venous Duplex is negative.   2. 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 His BP is well controlled. He is not smoking but dips tobacco. He denies any abdominal/ back pain. There is no pulsatile abdominal mass on exam. Recommendations: A. Monitor BP at home. Contact HCC should BP consistently exceed 140/90 or if SBP < 100 mmHg B. Encouraged to stop tobacco use.  C. Keep upcoming appt w/ Vascular Surgery re: AAA and L. CIA aneurysm  2. HTN & Mild AR/Family Hx Premature CAD-He denies anginal symptoms. He takes all of his medications in the at bedtime.He reports his blood pressure at home typically runs 130/70. He is requesting that we combine his current HTN medicines(amlodipine, losartan, HCTZ) into one medication. He had a negative nuclear stress test 2011. Requesting cardiac risk assessment prior to sinus surgery June 4. He has no HF on exam. BP/HR are controlled. Weight is stable. Recommendations: A. Restrict salt in diet (limit to no more than 2000mg /day).  B. Monitor BP at home. Contact HCC should BP consistently exceed 140/90 or if SBP < 100 mmHg C. Continue CV med(s):  Beta Blockers: no listed allergy, consider using if HR remains elevated  ACE Inhibitor/ARB with diuretic: Lisinopril caused hand swelling in the past, Hyzaar  100/25 mg 1 tab daily-to stop to start Exforge  CCB: Amlodipine 10 mg daily-to stop to start Exforge   CCB/ARB/Diuretic: start Exforge HCT (10/160/25 milligrams) 1 tab daily F. As above, encouraged to stop tobacco use  3. Carotid Artery Dissection- He denies any  stroke, TIA or amaurosis fugax symptoms. His neuro exam is normal. He has no evidence of cervical bruit. He denies any upper extremity claudication or symptoms of subclavian steal. He has equal upper extremity pulses. Review of scanned records show he had an upper extremity arterial duplex in 2011 showing dissection of the right CCA from the brachiocephalic trunk to the mid CCA. Recommendations: A. When next CT performed, ask to comment on R. CCA dissection.   Reproductive/Obstetrics negative OB ROS                             Anesthesia Physical  Anesthesia Plan  ASA: III  Anesthesia Plan: General   Post-op Pain Management:    Induction: Intravenous  PONV Risk Score and Plan: 1 and Ondansetron and Dexamethasone  Airway Management Planned: Oral ETT  Additional Equipment:   Intra-op Plan:   Post-operative Plan: Extubation in OR  Informed Consent: I have reviewed the patients History and Physical, chart, labs and discussed the procedure including the risks, benefits and alternatives for the proposed anesthesia with the patient or authorized representative who has indicated his/her understanding and acceptance.   Dental advisory given  Plan Discussed with:   Anesthesia Plan Comments:         Anesthesia Quick Evaluation

## 2017-08-30 NOTE — Discharge Instructions (Addendum)
Remove dressing in 24 hours. °May shower in 24 hours. °Leave paper strips in place. °Resume all home medications. °Follow-up with Dr. Cooper in 10 days. ° °AMBULATORY SURGERY  °DISCHARGE INSTRUCTIONS ° ° °1) The drugs that you were given will stay in your system until tomorrow so for the next 24 hours you should not: ° °A) Drive an automobile °B) Make any legal decisions °C) Drink any alcoholic beverage ° ° °2) You may resume regular meals tomorrow.  Today it is better to start with liquids and gradually work up to solid foods. ° °You may eat anything you prefer, but it is better to start with liquids, then soup and crackers, and gradually work up to solid foods. ° ° °3) Please notify your doctor immediately if you have any unusual bleeding, trouble breathing, redness and pain at the surgery site, drainage, fever, or pain not relieved by medication. ° ° ° °4) Additional Instructions: ° ° ° ° ° ° ° °Please contact your physician with any problems or Same Day Surgery at [phone], Monday through Friday 6 am to 4 pm, or [place] [facility] [place] [facility] number at [phone]. °

## 2017-08-30 NOTE — Anesthesia Procedure Notes (Signed)
Procedure Name: Intubation Date/Time: 08/30/2017 9:40 AM Performed by: Oliva Bustardorbett, Miquela Costabile E, CRNA Pre-anesthesia Checklist: Patient identified, Emergency Drugs available, Suction available, Patient being monitored and Timeout performed Patient Re-evaluated:Patient Re-evaluated prior to induction Oxygen Delivery Method: Circle system utilized Preoxygenation: Pre-oxygenation with 100% oxygen Induction Type: IV induction Ventilation: Mask ventilation without difficulty Laryngoscope Size: McGraph and 4 Grade View: Grade I Tube type: Oral Tube size: 7.5 mm Number of attempts: 1 Airway Equipment and Method: Stylet and Video-laryngoscopy Placement Confirmation: ETT inserted through vocal cords under direct vision,  positive ETCO2 and breath sounds checked- equal and bilateral Secured at: 23 cm Tube secured with: Tape Dental Injury: Teeth and Oropharynx as per pre-operative assessment  Difficulty Due To: Difficulty was anticipated

## 2017-09-01 ENCOUNTER — Encounter: Payer: Self-pay | Admitting: Surgery

## 2017-09-11 ENCOUNTER — Ambulatory Visit (INDEPENDENT_AMBULATORY_CARE_PROVIDER_SITE_OTHER): Payer: 59 | Admitting: Surgery

## 2017-09-11 ENCOUNTER — Encounter: Payer: Self-pay | Admitting: Surgery

## 2017-09-11 VITALS — BP 138/79 | HR 76 | Temp 97.8°F | Wt 278.0 lb

## 2017-09-11 DIAGNOSIS — K4090 Unilateral inguinal hernia, without obstruction or gangrene, not specified as recurrent: Secondary | ICD-10-CM

## 2017-09-11 NOTE — Progress Notes (Signed)
Outpatient postop visit  09/11/2017  Marcus Zavala is an 47 y.o. male.    Procedure: Lap inguinal hernia repair, right  CC: No problems  HPI: This patient status post laparoscopic repair of a right inguinal hernia.  Medications reviewed.    Physical Exam:  There were no vitals taken for this visit.    PE: No erythema no drainage no ecchymosis nontender testicles    Assessment/Plan:  Patient status post laparoscopic repair of a right inguinal hernia.  Patient doing very well recommend follow-up on an as-needed basis  Lattie Hawichard E Cooper, MD, FACS

## 2017-09-11 NOTE — Patient Instructions (Signed)
GENERAL POST-OPERATIVE PATIENT INSTRUCTIONS   WOUND CARE INSTRUCTIONS:  Keep a dry clean dressing on the wound if there is drainage. The initial bandage may be removed after 24 hours.  Once the wound has quit draining you may leave it open to air.  If clothing rubs against the wound or causes irritation and the wound is not draining you may cover it with a dry dressing during the daytime.  Try to keep the wound dry and avoid ointments on the wound unless directed to do so.  If the wound becomes bright red and painful or starts to drain infected material that is not clear, please contact your physician immediately.  If the wound is mildly pink and has a thick firm ridge underneath it, this is normal, and is referred to as a healing ridge.  This will resolve over the next 4-6 weeks.  BATHING: You may shower if you have been informed of this by your surgeon. However, Please do not submerge in a tub, hot tub, or pool until incisions are completely sealed or have been told by your surgeon that you may do so.  DIET:  You may eat any foods that you can tolerate.  It is a good idea to eat a high fiber diet and take in plenty of fluids to prevent constipation.  If you do become constipated you may want to take a mild laxative or take ducolax tablets on a daily basis until your bowel habits are regular.  Constipation can be very uncomfortable, along with straining, after recent surgery.  ACTIVITY:  You are encouraged to cough and deep breath or use your incentive spirometer if you were given one, every 15-30 minutes when awake.  This will help prevent respiratory complications and low grade fevers post-operatively if you had a general anesthetic.  You may want to hug a pillow when coughing and sneezing to add additional support to the surgical area, if you had abdominal or chest surgery, which will decrease pain during these times.  You are encouraged to walk and engage in light activity for the next two weeks.  You  should not lift more than 20 pounds, until 09/11/2017 as it could put you at increased risk for complications.  Twenty pounds is roughly equivalent to a plastic bag of groceries. At that time- Listen to your body when lifting, if you have pain when lifting, stop and then try again in a few days. Soreness after doing exercises or activities of daily living is normal as you get back in to your normal routine.  MEDICATIONS:  Try to take narcotic medications and anti-inflammatory medications, such as tylenol, ibuprofen, naprosyn, etc., with food.  This will minimize stomach upset from the medication.  Should you develop nausea and vomiting from the pain medication, or develop a rash, please discontinue the medication and contact your physician.  You should not drive, make important decisions, or operate machinery when taking narcotic pain medication.  SUNBLOCK Use sun block to incision area over the next year if this area will be exposed to sun. This helps decrease scarring and will allow you avoid a permanent darkened area over your incision.  QUESTIONS:  Please feel free to call our office if you have any questions, and we will be glad to assist you. [phone]    

## 2017-09-14 ENCOUNTER — Telehealth: Payer: Self-pay | Admitting: Surgery

## 2017-09-14 NOTE — Telephone Encounter (Signed)
Patient showed up to our office requesting for his return to work letter. Letter will be typed and given to the patient.

## 2017-09-14 NOTE — Telephone Encounter (Signed)
Patient is calling and is needing a release note to go back to work. Patient said he went back on Monday July 8th. Please call patient and advise.

## 2017-09-14 NOTE — Telephone Encounter (Signed)
Patient called back said he would be by this afternoon to pick up letter.

## 2017-09-14 NOTE — Telephone Encounter (Signed)
Patient came by and picked up letter 

## 2017-09-19 ENCOUNTER — Telehealth: Payer: Self-pay | Admitting: Surgery

## 2017-09-19 NOTE — Telephone Encounter (Signed)
Patient is calling and has some questions he recently had surgery done with Dr. Excell Seltzerooper and can be reached at [phone]. Please call patient and advise.

## 2017-09-19 NOTE — Telephone Encounter (Signed)
Patient added to schedule 09/20/17 due to testicle swelling.

## 2017-09-19 NOTE — Telephone Encounter (Signed)
Patient would like a call from the nurse. He did not leave any additional information on his message.

## 2017-09-20 ENCOUNTER — Encounter: Payer: Self-pay | Admitting: Surgery

## 2017-09-20 ENCOUNTER — Ambulatory Visit (INDEPENDENT_AMBULATORY_CARE_PROVIDER_SITE_OTHER): Payer: 59 | Admitting: Surgery

## 2017-09-20 VITALS — BP 160/91 | HR 75 | Temp 97.6°F | Ht 75.0 in | Wt 275.6 lb

## 2017-09-20 DIAGNOSIS — Z09 Encounter for follow-up examination after completed treatment for conditions other than malignant neoplasm: Secondary | ICD-10-CM

## 2017-09-20 NOTE — Progress Notes (Signed)
S/p lap IH by Dr. Excell Seltzerooper now comes w  Scrotal edema and some pain No fevers, no chills  PE NAD Incisions healing well, no infection no recurrence Moderate hydrocele Right , no evidence of  torsion or testicular ischemia  A/p Hydrocele Right  D/W the pt that it should improve w time and is not unusual to develop considering the large size He will call us back

## 2017-09-20 NOTE — Patient Instructions (Signed)
Please call our office if you have questions or concerns.   

## 2017-10-17 ENCOUNTER — Telehealth: Payer: Self-pay | Admitting: Surgery

## 2017-10-17 NOTE — Telephone Encounter (Signed)
Left message for patient to return call to office regarding needing a note.

## 2017-10-17 NOTE — Telephone Encounter (Signed)
Patient called over to [facility] asking for a doctors note to be faxed to [phone] attn: Amada. Please call patient if any questions.

## 2018-02-02 IMAGING — CR DG CHEST 2V
1 series · 2 of 2 positions shown · non-contrast
Comparison: 12/13/2014

CLINICAL DATA: Bronchitis

EXAM:
CHEST  2 VIEW

[Series 1: dg chest 2 view · 0.14mm/px · 2 of 2 slices shown]
[im 1/2]
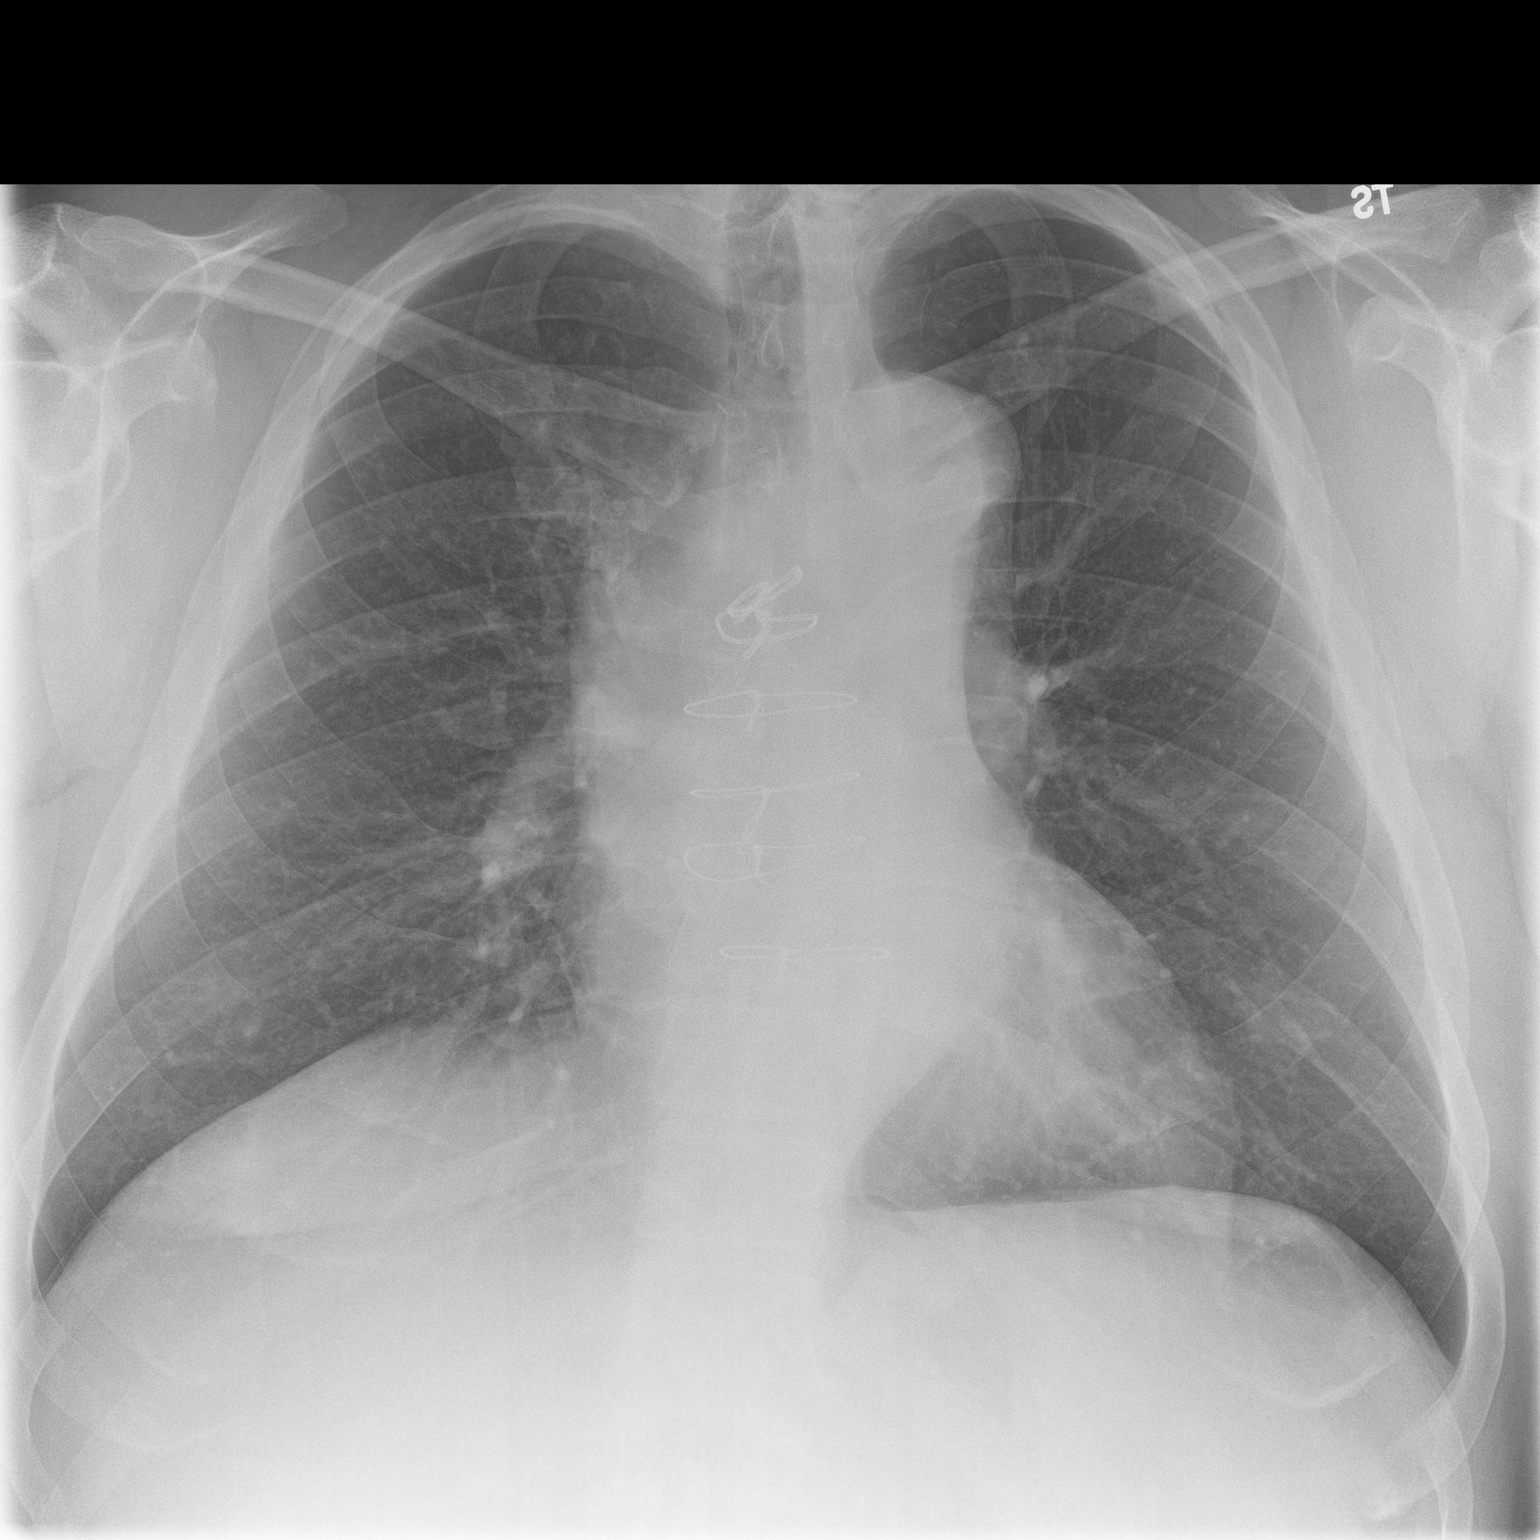
[im 2/2]
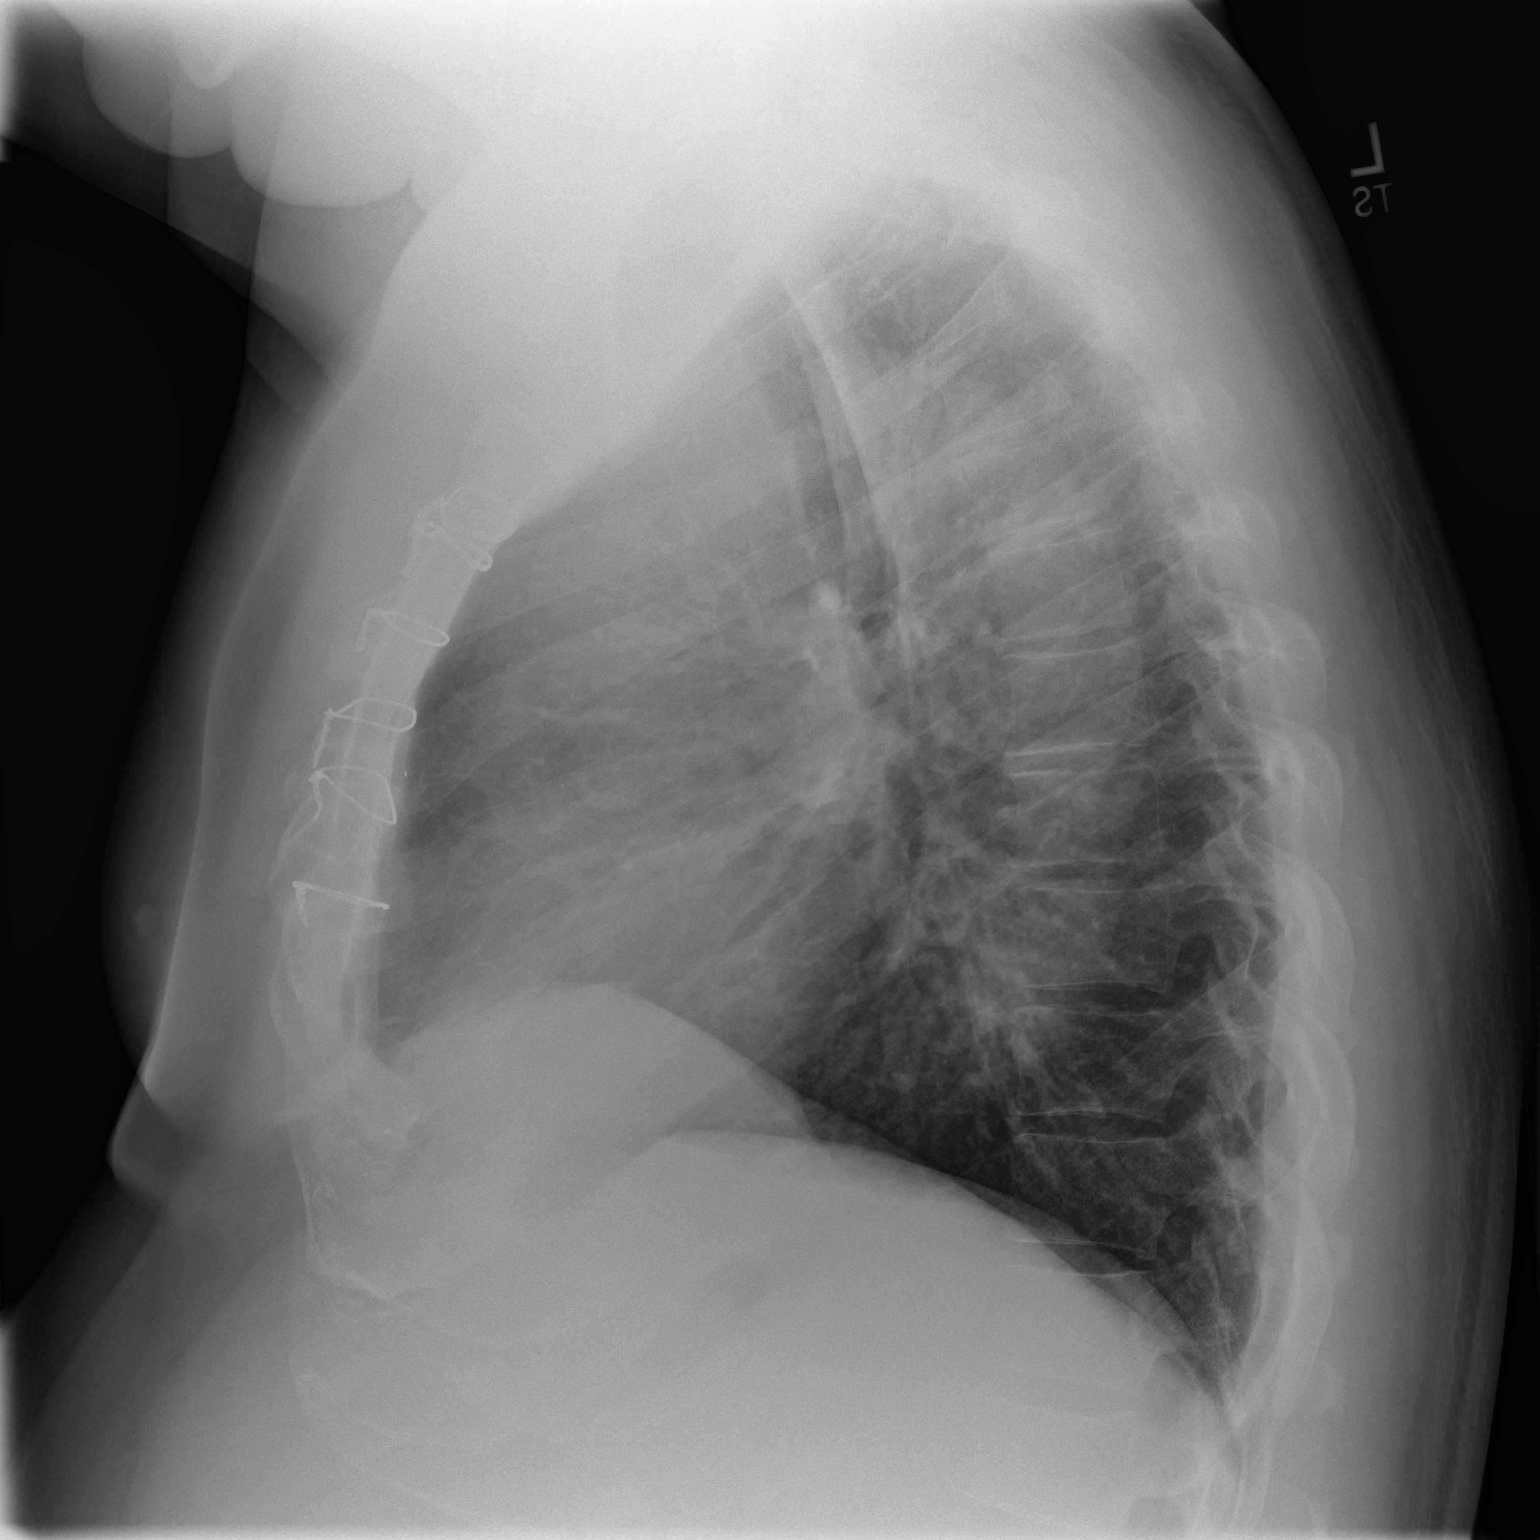

[2 of 2 positions shown; findings below may reference images not displayed]

FINDINGS: Cardiomediastinal silhouette is stable. Status post median
sternotomy. No infiltrate or pulmonary edema. Mild infrahilar
bronchitic changes.
IMPRESSION: No infiltrate or pulmonary edema. Mild infrahilar bronchitic
changes. Status post median sternotomy.

## 2019-05-05 ENCOUNTER — Ambulatory Visit: Payer: 59 | Attending: Internal Medicine

## 2019-05-05 DIAGNOSIS — Z23 Encounter for immunization: Secondary | ICD-10-CM

## 2019-05-05 NOTE — Progress Notes (Signed)
   Covid-19 Vaccination Clinic  Name:  Marcus Zavala    MRN: [id number] DOB: 12-12-1970  05/05/2019  Marcus Zavala was observed post Covid-19 immunization for 15 minutes without incidence. He was provided with Vaccine Information Sheet and instruction to access the V-Safe system.   Marcus Zavala was instructed to call 911 with any severe reactions post vaccine: Marland Kitchen Difficulty breathing  . Swelling of your face and throat  . A fast heartbeat  . A bad rash all over your body  . Dizziness and weakness    Immunizations Administered    Name Date Dose VIS Date Route   Pfizer COVID-19 Vaccine 05/05/2019  8:43 AM 0.3 mL 02/15/2019 Intramuscular   Manufacturer: [employer], [employer]   Lot: [id number]   NDC: [address]-3

## 2019-05-27 ENCOUNTER — Ambulatory Visit: Payer: 59 | Attending: Internal Medicine

## 2019-05-27 DIAGNOSIS — Z23 Encounter for immunization: Secondary | ICD-10-CM

## 2019-05-27 NOTE — Progress Notes (Signed)
   Covid-19 Vaccination Clinic  Name:  NATHANIEL WAKELEY    MRN: [id number] DOB: [birth year]  05/27/2019  Mr. Igo was observed post Covid-19 immunization for 15 minutes without incident. He was provided with Vaccine Information Sheet and instruction to access the V-Safe system.   Mr. Detienne was instructed to call 911 with any severe reactions post vaccine: Marland Kitchen Difficulty breathing  . Swelling of face and throat  . A fast heartbeat  . A bad rash all over body  . Dizziness and weakness   Immunizations Administered    Name Date Dose VIS Date Route   Pfizer COVID-19 Vaccine 05/27/2019  9:07 AM 0.3 mL 02/15/2019 Intramuscular   Manufacturer: [employer], [employer]   Lot: [id number]   NDC: [address]-2

## 2020-01-29 ENCOUNTER — Encounter: Payer: Self-pay | Admitting: General Practice

## 2020-02-13 ENCOUNTER — Telehealth: Payer: Self-pay

## 2020-02-13 ENCOUNTER — Telehealth: Payer: 59

## 2020-02-13 NOTE — Telephone Encounter (Signed)
Attempted to contact patient this morning for 11 am colonoscopy triage.  LVM for him to call front office back once he gets the message.  Thanks,  [place], [place]

## 2022-05-02 ENCOUNTER — Encounter: Payer: Self-pay | Admitting: *Deleted

## 2022-05-03 ENCOUNTER — Ambulatory Visit
Admission: RE | Admit: 2022-05-03 | Discharge: 2022-05-03 | Disposition: A | Discharge status: Home / Self Care | Payer: 59 | Attending: Gastroenterology | Admitting: Gastroenterology

## 2022-05-03 ENCOUNTER — Encounter: Payer: Self-pay | Admitting: *Deleted

## 2022-05-03 ENCOUNTER — Encounter
Admission: RE | Disposition: A | Discharge status: Home / Self Care | Payer: Self-pay | Source: Home / Self Care | Attending: Gastroenterology

## 2022-05-03 ENCOUNTER — Ambulatory Visit: Payer: 59 | Admitting: Anesthesiology

## 2022-05-03 DIAGNOSIS — Z1211 Encounter for screening for malignant neoplasm of colon: Secondary | ICD-10-CM | POA: Insufficient documentation

## 2022-05-03 DIAGNOSIS — Z8679 Personal history of other diseases of the circulatory system: Secondary | ICD-10-CM | POA: Insufficient documentation

## 2022-05-03 DIAGNOSIS — I10 Essential (primary) hypertension: Secondary | ICD-10-CM | POA: Diagnosis not present

## 2022-05-03 DIAGNOSIS — K640 First degree hemorrhoids: Secondary | ICD-10-CM | POA: Diagnosis not present

## 2022-05-03 DIAGNOSIS — Z7984 Long term (current) use of oral hypoglycemic drugs: Secondary | ICD-10-CM | POA: Diagnosis not present

## 2022-05-03 DIAGNOSIS — G4733 Obstructive sleep apnea (adult) (pediatric): Secondary | ICD-10-CM | POA: Diagnosis not present

## 2022-05-03 DIAGNOSIS — E119 Type 2 diabetes mellitus without complications: Secondary | ICD-10-CM | POA: Insufficient documentation

## 2022-05-03 DIAGNOSIS — K5730 Diverticulosis of large intestine without perforation or abscess without bleeding: Secondary | ICD-10-CM | POA: Diagnosis not present

## 2022-05-03 HISTORY — PX: COLONOSCOPY WITH PROPOFOL: SHX5780

## 2022-05-03 HISTORY — DX: Prediabetes: R73.03

## 2022-05-03 SURGERY — COLONOSCOPY WITH PROPOFOL
Anesthesia: General

## 2022-05-03 MED ORDER — PROPOFOL 500 MG/50ML IV EMUL
INTRAVENOUS | Status: DC | PRN
  Administered 2022-05-03: 165 ug/kg/min via INTRAVENOUS

## 2022-05-03 MED ORDER — PROPOFOL 10 MG/ML IV BOLUS
INTRAVENOUS | Status: DC | PRN
  Administered 2022-05-03: 7 mg via INTRAVENOUS

## 2022-05-03 MED ORDER — SODIUM CHLORIDE 0.9 % IV SOLN: INTRAVENOUS | Status: DC

## 2022-05-03 MED ORDER — LIDOCAINE HCL (CARDIAC) PF 100 MG/5ML IV SOSY
PREFILLED_SYRINGE | INTRAVENOUS | Status: DC | PRN
  Administered 2022-05-03: 100 mg via INTRAVENOUS

## 2022-05-03 MED ORDER — EPHEDRINE SULFATE (PRESSORS) 50 MG/ML IJ SOLN
INTRAMUSCULAR | Status: DC | PRN
  Administered 2022-05-03: 5 mg via INTRAVENOUS

## 2022-05-03 NOTE — Transfer of Care (Signed)
Immediate Anesthesia Transfer of Care Note  Patient: Marcus Zavala  Procedure(s) Performed: COLONOSCOPY WITH PROPOFOL  Patient Location: Endoscopy Unit  Anesthesia Type:General  Level of Consciousness: awake, drowsy, and patient cooperative  Airway & Oxygen Therapy: Patient Spontanous Breathing and Patient connected to face mask oxygen  Post-op Assessment: Report given to RN and Post -op Vital signs reviewed and stable  Post vital signs: Reviewed and stable  Last Vitals:  Vitals Value Taken Time  BP 103/62 05/03/22 0959  Temp 36.2 C 05/03/22 0959  Pulse 70 05/03/22 1007  Resp 12 05/03/22 1007  SpO2 99 % 05/03/22 1007  Vitals shown include unvalidated device data.  Last Pain:  Vitals:   05/03/22 0959  TempSrc: Temporal  PainSc: Asleep         Complications: No notable events documented.

## 2022-05-03 NOTE — Anesthesia Procedure Notes (Signed)
Procedure Name: General with mask airway Date/Time: 05/03/2022 9:36 AM  Performed by: Kelton Pillar, CRNAPre-anesthesia Checklist: Patient identified, Emergency Drugs available, Suction available and Patient being monitored Patient Re-evaluated:Patient Re-evaluated prior to induction Oxygen Delivery Method: Simple face mask Induction Type: IV induction Placement Confirmation: positive ETCO2, CO2 detector and breath sounds checked- equal and bilateral Dental Injury: Teeth and Oropharynx as per pre-operative assessment

## 2022-05-03 NOTE — Op Note (Signed)
[facility] Patient Name: Marcus Zavala Procedure Date: 05/03/2022 9:25 AM MRN: [id number] Account #: [id number] Date of Birth: 12/25/1970 Admit Type: Outpatient Age: 52 Room: [facility] ENDO ROOM 1 Gender: Male Note Status: Finalized Instrument Name: Colonscope [id number] Procedure:             Colonoscopy Indications:           Screening for colorectal malignant neoplasm Providers:             Andrey Farmer MD, MD Referring MD:          Irven Easterly. Kary Kos, MD (Referring MD) Medicines:             Monitored Anesthesia Care Complications:         No immediate complications. Procedure:             Pre-Anesthesia Assessment:                        - Prior to the procedure, a History and Physical was                         performed, and patient medications and allergies were                         reviewed. The patient is competent. The risks and                         benefits of the procedure and the sedation options and                         risks were discussed with the patient. All questions                         were answered and informed consent was obtained.                         Patient identification and proposed procedure were                         verified by the physician, the nurse, the                         anesthesiologist, the anesthetist and the technician                         in the endoscopy suite. Mental Status Examination:                         alert and oriented. Airway Examination: normal                         oropharyngeal airway and neck mobility. Respiratory                         Examination: clear to auscultation. CV Examination:                         normal. Prophylactic Antibiotics: The patient does not  require prophylactic antibiotics. Prior                         Anticoagulants: The patient has taken no anticoagulant                         or antiplatelet agents. ASA Grade  Assessment: III - A                         patient with severe systemic disease. After reviewing                         the risks and benefits, the patient was deemed in                         satisfactory condition to undergo the procedure. The                         anesthesia plan was to use monitored anesthesia care                         (MAC). Immediately prior to administration of                         medications, the patient was re-assessed for adequacy                         to receive sedatives. The heart rate, respiratory                         rate, oxygen saturations, blood pressure, adequacy of                         pulmonary ventilation, and response to care were                         monitored throughout the procedure. The physical                         status of the patient was re-assessed after the                         procedure.                        After obtaining informed consent, the colonoscope was                         passed under direct vision. Throughout the procedure,                         the patient's blood pressure, pulse, and oxygen                         saturations were monitored continuously. The                         Colonoscope was introduced through the anus and  advanced to the the cecum, identified by appendiceal                         orifice and ileocecal valve. The colonoscopy was                         performed without difficulty. The patient tolerated                         the procedure well. The quality of the bowel                         preparation was fair. The ileocecal valve, appendiceal                         orifice, and rectum were photographed. Findings:      The perianal and digital rectal examinations were normal.      Multiple small-mouthed diverticula were found in the sigmoid colon,       descending colon, transverse colon and ascending colon.      Internal hemorrhoids  were found during retroflexion. The hemorrhoids       were Grade I (internal hemorrhoids that do not prolapse).      The exam was otherwise without abnormality on direct and retroflexion       views. Impression:            - Preparation of the colon was fair.                        - Diverticulosis in the sigmoid colon, in the                         descending colon, in the transverse colon and in the                         ascending colon.                        - Internal hemorrhoids.                        - The examination was otherwise normal on direct and                         retroflexion views.                        - No specimens collected. Recommendation:        - Discharge patient to home.                        - Resume previous diet.                        - Continue present medications.                        - Repeat colonoscopy in 2-3 years because the bowel                         preparation was suboptimal.                        -  Return to referring physician as previously                         scheduled. Procedure Code(s):     --- Professional ---                        [id number], Colorectal cancer screening; colonoscopy on                         individual not meeting criteria for high risk Diagnosis Code(s):     --- Professional ---                        Z12.11, Encounter for screening for malignant neoplasm                         of colon                        K64.0, First degree hemorrhoids                        K57.30, Diverticulosis of large intestine without                         perforation or abscess without bleeding CPT copyright 2022 [employer]. All rights reserved. The codes documented in this report are preliminary and upon coder review may  be revised to meet current compliance requirements. Andrey Farmer MD, MD 05/03/2022 10:08:07 AM Number of Addenda: 0 Note Initiated On: 05/03/2022 9:25 AM Scope Withdrawal Time: 0  hours 7 minutes 2 seconds  Total Procedure Duration: 0 hours 10 minutes 59 seconds  Estimated Blood Loss:  Estimated blood loss: none.      [facility]

## 2022-05-03 NOTE — Interval H&P Note (Signed)
History and Physical Interval Note:  05/03/2022 9:38 AM  Marcus Zavala  has presented today for surgery, with the diagnosis of Colon Cancer screening.  The various methods of treatment have been discussed with the patient and family. After consideration of risks, benefits and other options for treatment, the patient has consented to  Procedure(s): COLONOSCOPY WITH PROPOFOL (N/A) as a surgical intervention.  The patient's history has been reviewed, patient examined, no change in status, stable for surgery.  I have reviewed the patient's chart and labs.  Questions were answered to the patient's satisfaction.     Lesly Rubenstein  Ok to proceed with colonoscopy

## 2022-05-03 NOTE — Anesthesia Preprocedure Evaluation (Signed)
Anesthesia Evaluation  Patient identified by MRN, date of birth, ID band Patient awake    Reviewed: Allergy & Precautions, H&P , NPO status , Patient's Chart, lab work & pertinent test results, reviewed documented beta blocker date and time   Airway Mallampati: II   Neck ROM: full    Dental  (+) Poor Dentition   Pulmonary sleep apnea and Continuous Positive Airway Pressure Ventilation    Pulmonary exam normal        Cardiovascular Exercise Tolerance: Good hypertension, On Medications negative cardio ROS Normal cardiovascular exam Rhythm:regular Rate:Normal     Neuro/Psych   Anxiety     negative neurological ROS  negative psych ROS   GI/Hepatic negative GI ROS, Neg liver ROS,,,  Endo/Other  negative endocrine ROSdiabetes, Well Controlled    Renal/GU negative Renal ROS  negative genitourinary   Musculoskeletal   Abdominal   Peds  Hematology negative hematology ROS (+)   Anesthesia Other Findings Past Medical History: 11/30/2016: Abdominal aortic aneurysm (AAA) without rupture ([place]) 11/06/2014: Allergic rhinitis No date: Allergy No date: Aortic dissection ([place]) 06/28/2016: Diabetes mellitus type 2, controlled, without  complications ([place]) No date: Hypertension No date: Sleep apnea     Comment:  CPAP 06/24/2011: Testosterone deficiency     Comment:  Overview:  Serum testosterone 159 , Hgb 13.5, PSA 1.1               07/2011 Replacement start 07/2011 Axiron too expensive.               Alternate agent requested, Androderm and androgel were               not effective in the past.  06/24/2011: Vitamin D deficiency, unspecified Past Surgical History: 03/2009: AORTA SURGERY 10/14/2016: ETHMOIDECTOMY; Bilateral     Comment:  Procedure: ETHMOIDECTOMY total with tissue removal;                Surgeon: Beverly Gust, MD;  Location: [place];  Service: ENT;  Laterality: Bilateral; 10/14/2016: IMAGE  GUIDED SINUS SURGERY; N/A     Comment:  Procedure: IMAGE GUIDED SINUS SURGERY;  Surgeon:               Beverly Gust, MD;  Location: [place];                Service: ENT;  Laterality: N/A;  sleep apnea-CPAP NEED               DISK NEED BALLOON REP PRESENT STRYKER GAVE DISK TO               CECE 4-5 KP 08/30/2017: INGUINAL HERNIA REPAIR; Right     Comment:  Procedure: LAPAROSCOPIC INGUINAL HERNIA SCROTAL;                Surgeon: Florene Glen, MD;  Location: [facility];                Service: General;  Laterality: Right;  crotal hernia 10/14/2016: MAXILLARY ANTROSTOMY; Bilateral     Comment:  Procedure: MAXILLARY ANTROSTOMY with tissue removal;                Surgeon: Beverly Gust, MD;  Location: [place];  Service: ENT;  Laterality: Bilateral; 10/14/2016: NASAL TURBINATE REDUCTION; Bilateral     Comment:  Procedure: SUBMUCOSAL RESECTION;  Surgeon: Beverly Gust, MD;  Location: [place];  Service:               ENT;  Laterality: Bilateral; 10/14/2016: SEPTOPLASTY; N/A     Comment:  Procedure: SEPTOPLASTY revision;  Surgeon: Beverly Gust, MD;  Location: [place];  Service:               ENT;  Laterality: N/A; 10/14/2016: SINUSOTOMY; Right     Comment:  Procedure: frontal SINUSOTOMY with possible balloon;                Surgeon: Beverly Gust, MD;  Location: [place];  Service: ENT;  Laterality: Right;   Reproductive/Obstetrics negative OB ROS                             Anesthesia Physical Anesthesia Plan  ASA: 3  Anesthesia Plan: General   Post-op Pain Management:    Induction:   PONV Risk Score and Plan:   Airway Management Planned:   Additional Equipment:   Intra-op Plan:   Post-operative Plan:   Informed Consent: I have reviewed the patients History and Physical, chart, labs and discussed the procedure including  the risks, benefits and alternatives for the proposed anesthesia with the patient or authorized representative who has indicated his/her understanding and acceptance.     Dental Advisory Given  Plan Discussed with: CRNA  Anesthesia Plan Comments:        Anesthesia Quick Evaluation

## 2022-05-03 NOTE — H&P (Signed)
Outpatient short stay form Pre-procedure 05/03/2022  Lesly Rubenstein, MD  Primary Physician: Maryland Pink, MD  Reason for visit:  Screening  History of present illness:    52 y/o gentleman with history of OSA, hypertension, DM II, and AAA here for index screening colonoscopy. No blood thinners. No family history of GI malignancies. History of AAA repair and inguinal hernia repair.    Current Facility-Administered Medications:    0.9 %  sodium chloride infusion, , Intravenous, Continuous, Amaryah Mallen, Hilton Cork, MD, Last Rate: 20 mL/hr at 05/03/22 0915, New Bag at 05/03/22 0915  Medications Prior to Admission  Medication Sig Dispense Refill Last Dose   amLODIPine-Valsartan-HCTZ 10-160-25 MG TABS Take 1 tablet by mouth daily.  3 05/02/2022   Empagliflozin (JARDIANCE PO) Take 25 mg by mouth.      HYDROcodone-Acetaminophen (VICODIN) 5-300 MG TABS Take 1 tablet by mouth every 4 (four) hours as needed. (Patient not taking: Reported on 05/03/2022) 20 each 0 Not Taking   Multiple Vitamin (MULTIVITAMIN) capsule Take 1 capsule by mouth daily. Reported on 07/14/2015 (Patient not taking: Reported on 05/03/2022)   Not Taking   NEEDLE, DISP, 22 G 22G X 1-1/2" MISC Reported on 07/14/2015      testosterone cypionate (DEPO-TESTOSTERONE) 100 MG/ML injection Every 10 days        Allergies  Allergen Reactions   Lisinopril Swelling    Hand swelling   Penicillins Hives   Ciprofloxacin Nausea Only     Past Medical History:  Diagnosis Date   Abdominal aortic aneurysm (AAA) without rupture ([place]) 11/30/2016   Allergic rhinitis 11/06/2014   Allergy    Aortic dissection ([place])    Hypertension    Pre-diabetes    Sleep apnea    CPAP   Testosterone deficiency 06/24/2011   Overview:  Serum testosterone 159 , Hgb 13.5, PSA 1.1 07/2011 Replacement start 07/2011 Axiron too expensive. Alternate agent requested, Androderm and androgel were not effective in the past.    Vitamin D deficiency, unspecified 06/24/2011     Review of systems:  Otherwise negative.    Physical Exam  Gen: Alert, oriented. Appears stated age.  HEENT: PERRLA. Lungs: No respiratory distress CV: RR Abd: soft, benign, no masses Ext: No edema    Planned procedures: Proceed with colonoscopy. The patient understands the nature of the planned procedure, indications, risks, alternatives and potential complications including but not limited to bleeding, infection, perforation, damage to internal organs and possible oversedation/side effects from anesthesia. The patient agrees and gives consent to proceed.  Please refer to procedure notes for findings, recommendations and patient disposition/instructions.     Lesly Rubenstein, MD [facility]

## 2022-05-04 ENCOUNTER — Encounter: Payer: Self-pay | Admitting: Gastroenterology

## 2022-05-06 NOTE — Anesthesia Postprocedure Evaluation (Signed)
Anesthesia Post Note  Patient: Marcus Zavala  Procedure(s) Performed: COLONOSCOPY WITH PROPOFOL  Patient location during evaluation: PACU Anesthesia Type: General Level of consciousness: awake and alert Pain management: pain level controlled Vital Signs Assessment: post-procedure vital signs reviewed and stable Respiratory status: spontaneous breathing, nonlabored ventilation, respiratory function stable and patient connected to nasal cannula oxygen Cardiovascular status: blood pressure returned to baseline and stable Postop Assessment: no apparent nausea or vomiting Anesthetic complications: no   No notable events documented.   Last Vitals:  Vitals:   05/03/22 1009 05/03/22 1019  BP: 105/62 121/77  Pulse: 68 72  Resp: 12 19  Temp:    SpO2: 99% 97%    Last Pain:  Vitals:   05/03/22 1019  TempSrc:   PainSc: 0-No pain                 Molli Barrows
